# Patient Record
Sex: Female | Born: 1975
Health system: Southern US, Community
[De-identification: ages and names within clinical notes are randomized; demographics above are authoritative.]

## PROBLEM LIST (undated history)

## (undated) DIAGNOSIS — D649 Anemia, unspecified: Secondary | ICD-10-CM

## (undated) DIAGNOSIS — G43909 Migraine, unspecified, not intractable, without status migrainosus: Secondary | ICD-10-CM

## (undated) HISTORY — PX: WISDOM TOOTH EXTRACTION: SHX21

---

## 2002-09-13 ENCOUNTER — Other Ambulatory Visit: Admission: RE | Admit: 2002-09-13 | Discharge: 2002-09-13 | Payer: Self-pay | Admitting: Family Medicine

## 2002-12-20 ENCOUNTER — Encounter: Payer: Self-pay | Admitting: Family Medicine

## 2002-12-20 ENCOUNTER — Encounter: Admission: RE | Admit: 2002-12-20 | Discharge: 2002-12-20 | Payer: Self-pay | Admitting: Family Medicine

## 2003-02-02 ENCOUNTER — Encounter: Admission: RE | Admit: 2003-02-02 | Discharge: 2003-02-02 | Payer: Self-pay | Admitting: Family Medicine

## 2003-02-02 ENCOUNTER — Encounter: Payer: Self-pay | Admitting: Family Medicine

## 2003-12-13 ENCOUNTER — Encounter: Admission: RE | Admit: 2003-12-13 | Discharge: 2003-12-13 | Payer: Self-pay | Admitting: Family Medicine

## 2004-02-06 IMAGING — CR DG LUMBAR SPINE COMPLETE 4+V
5 series · 5 of 5 positions shown · non-contrast
Comparison: none

CLINICAL DATA: Low back pain. 
 LUMBAR SPINE FOUR VIEWS: 
 There are four lumbar type vertebrae with apparent sacralization of L5 or rudimentary ribs of L1.  These findings are similar to the previous study.  There is normal alignment.  No pars defects.  No acute abnormalities.

[view not recorded (1 of 5)]
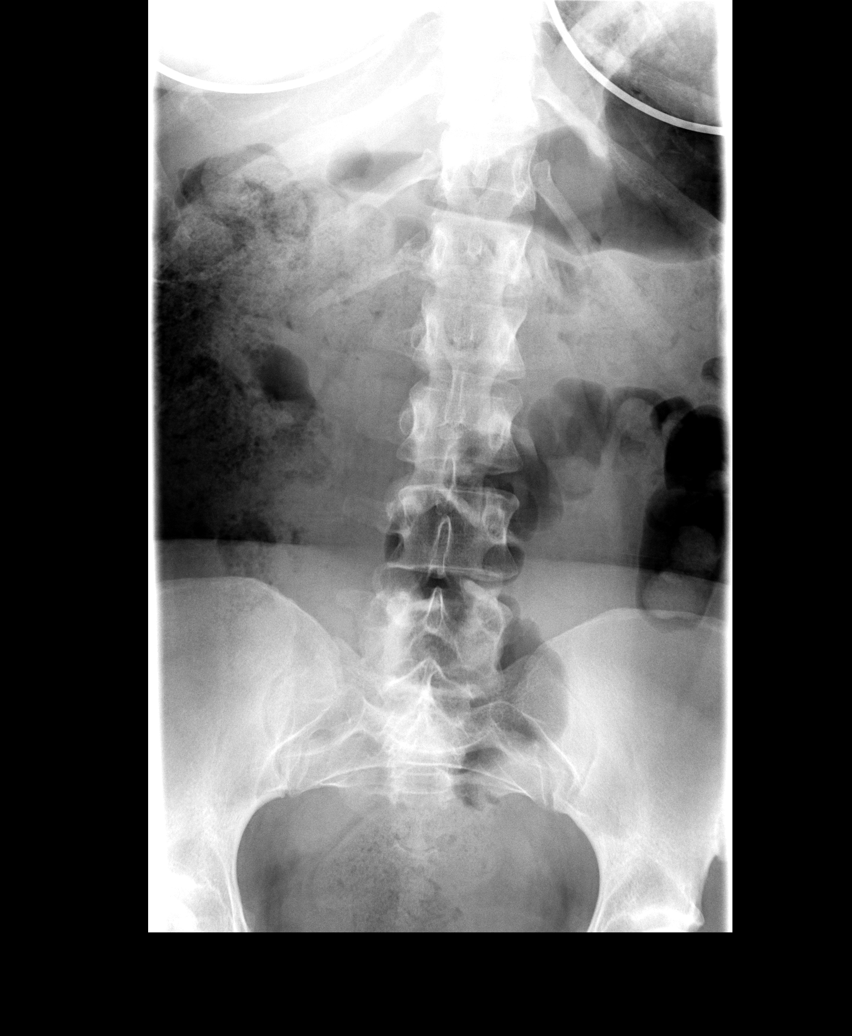

[view not recorded (2 of 5)]
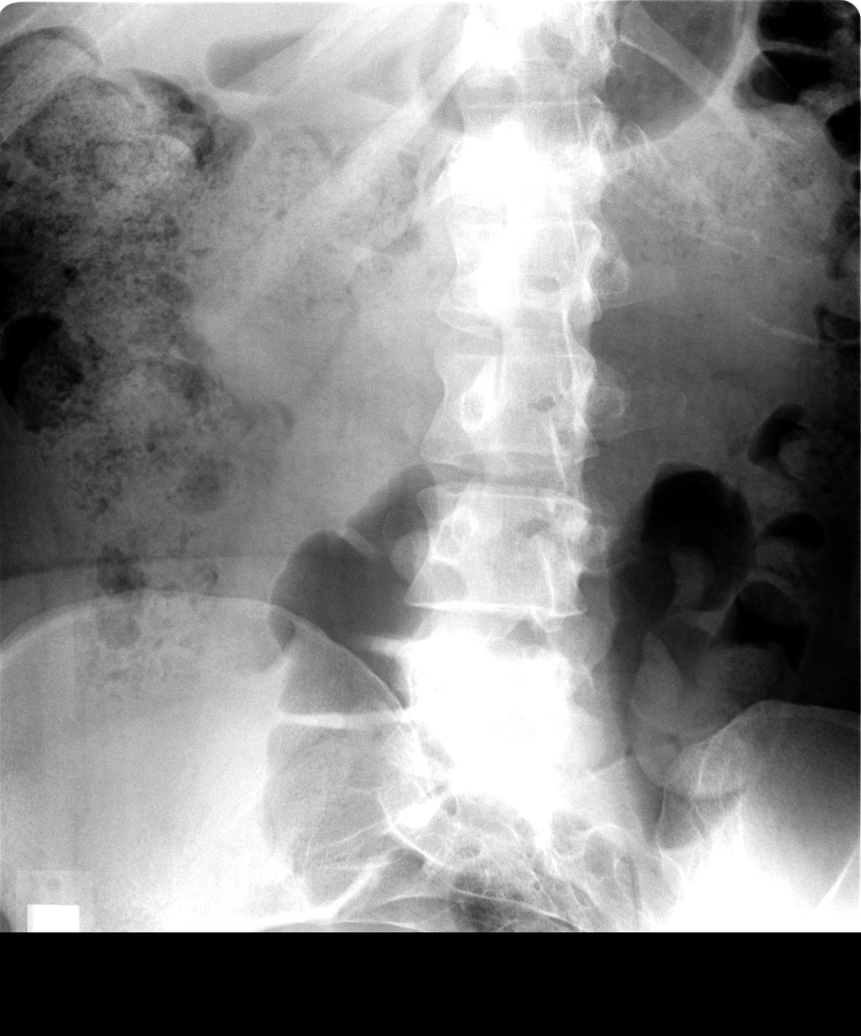

[view not recorded (3 of 5)]
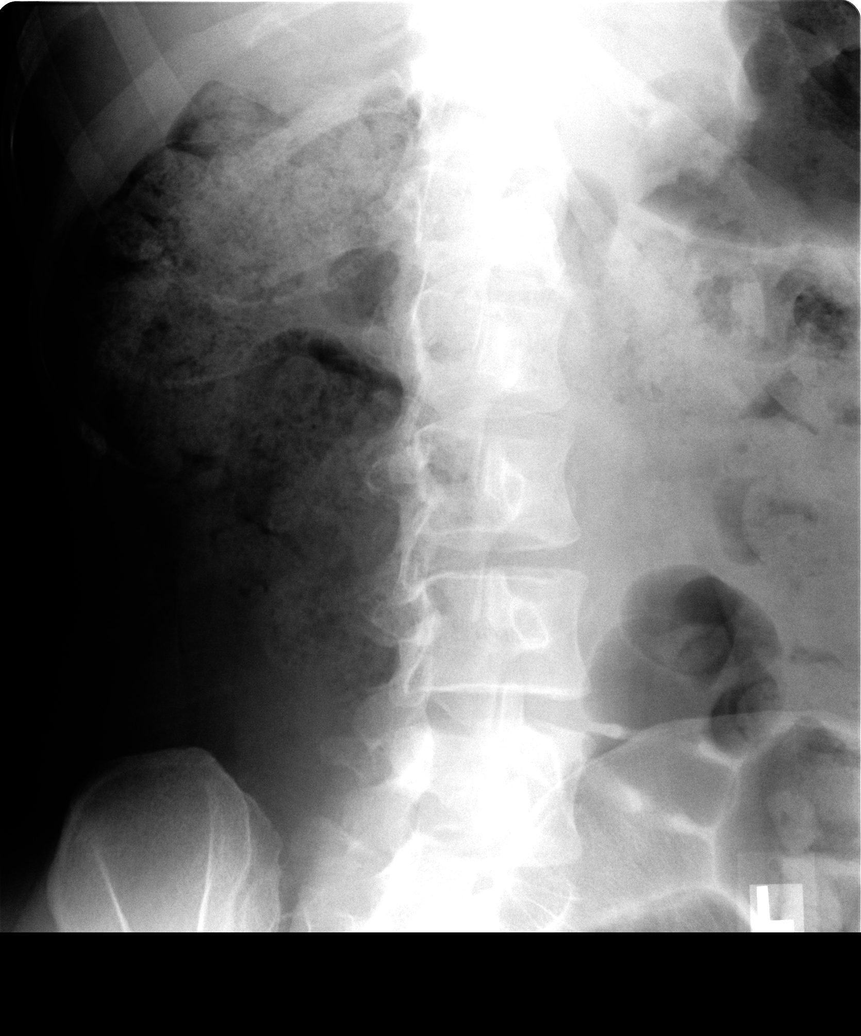

[view not recorded (4 of 5)]
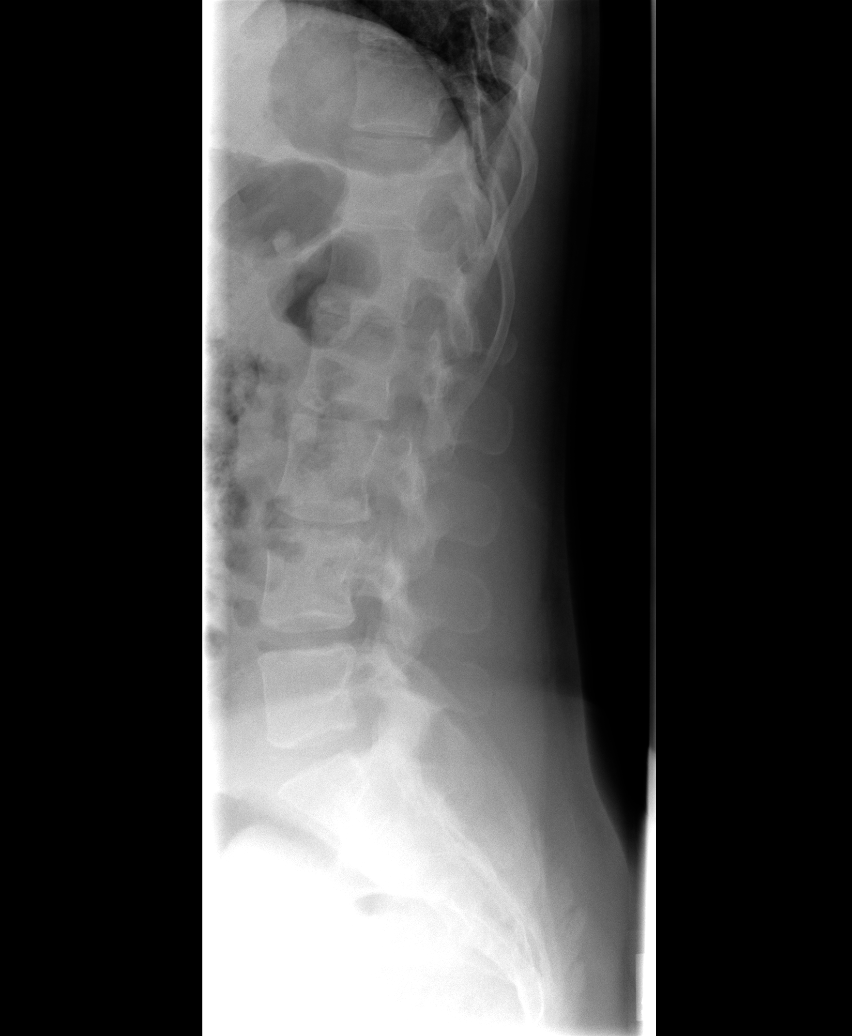

[view not recorded (5 of 5)]
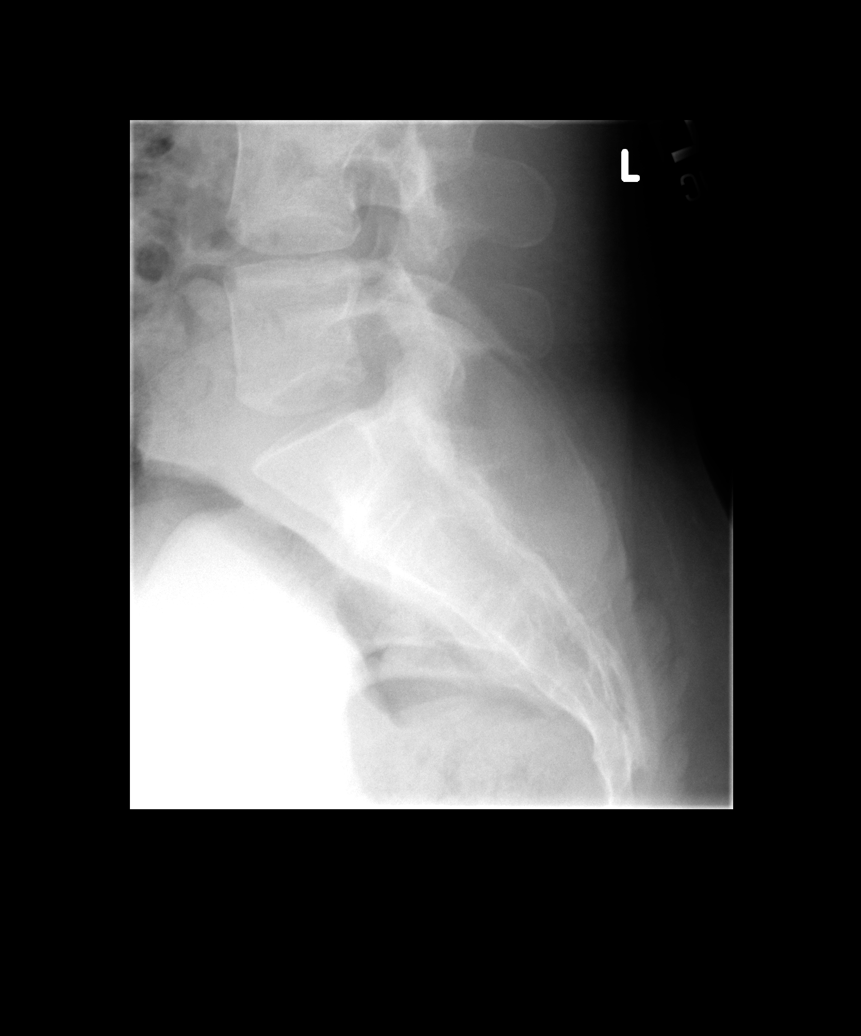

[5 of 5 positions shown; findings below may reference images not displayed]

IMPRESSION: No change.  No bony abnormality.

## 2004-05-03 ENCOUNTER — Other Ambulatory Visit: Admission: RE | Admit: 2004-05-03 | Discharge: 2004-05-03 | Payer: Self-pay | Admitting: Family Medicine

## 2006-04-24 ENCOUNTER — Ambulatory Visit (HOSPITAL_COMMUNITY): Admission: RE | Admit: 2006-04-24 | Discharge: 2006-04-24 | Payer: Self-pay | Admitting: Family Medicine

## 2006-08-27 ENCOUNTER — Ambulatory Visit (HOSPITAL_COMMUNITY): Admission: RE | Admit: 2006-08-27 | Discharge: 2006-08-27 | Payer: Self-pay | Admitting: Family Medicine

## 2007-07-23 ENCOUNTER — Ambulatory Visit (HOSPITAL_COMMUNITY): Admission: RE | Admit: 2007-07-23 | Discharge: 2007-07-23 | Payer: Self-pay | Admitting: Family Medicine

## 2008-05-15 ENCOUNTER — Encounter: Admission: RE | Admit: 2008-05-15 | Discharge: 2008-05-15 | Payer: Self-pay | Admitting: Family Medicine

## 2009-08-06 ENCOUNTER — Encounter: Admission: RE | Admit: 2009-08-06 | Discharge: 2009-08-06 | Payer: Self-pay | Admitting: Family Medicine

## 2010-12-01 ENCOUNTER — Encounter: Payer: Self-pay | Admitting: Family Medicine

## 2015-01-04 ENCOUNTER — Other Ambulatory Visit: Payer: Self-pay | Admitting: Family Medicine

## 2015-01-04 ENCOUNTER — Other Ambulatory Visit: Payer: Self-pay

## 2015-01-04 DIAGNOSIS — Z1231 Encounter for screening mammogram for malignant neoplasm of breast: Secondary | ICD-10-CM

## 2015-10-21 ENCOUNTER — Encounter (HOSPITAL_BASED_OUTPATIENT_CLINIC_OR_DEPARTMENT_OTHER): Payer: Self-pay | Admitting: *Deleted

## 2015-10-21 ENCOUNTER — Emergency Department (HOSPITAL_BASED_OUTPATIENT_CLINIC_OR_DEPARTMENT_OTHER)
Admission: EM | Admit: 2015-10-21 | Discharge: 2015-10-21 | Disposition: A | Discharge status: Home / Self Care | Payer: No Typology Code available for payment source | Attending: Emergency Medicine | Admitting: Emergency Medicine

## 2015-10-21 DIAGNOSIS — Y92410 Unspecified street and highway as the place of occurrence of the external cause: Secondary | ICD-10-CM | POA: Insufficient documentation

## 2015-10-21 DIAGNOSIS — Z880 Allergy status to penicillin: Secondary | ICD-10-CM | POA: Diagnosis not present

## 2015-10-21 DIAGNOSIS — Y9389 Activity, other specified: Secondary | ICD-10-CM | POA: Insufficient documentation

## 2015-10-21 DIAGNOSIS — Z8679 Personal history of other diseases of the circulatory system: Secondary | ICD-10-CM | POA: Insufficient documentation

## 2015-10-21 DIAGNOSIS — S161XXA Strain of muscle, fascia and tendon at neck level, initial encounter: Secondary | ICD-10-CM | POA: Insufficient documentation

## 2015-10-21 DIAGNOSIS — Y998 Other external cause status: Secondary | ICD-10-CM | POA: Insufficient documentation

## 2015-10-21 DIAGNOSIS — S3992XA Unspecified injury of lower back, initial encounter: Secondary | ICD-10-CM | POA: Diagnosis not present

## 2015-10-21 DIAGNOSIS — S199XXA Unspecified injury of neck, initial encounter: Secondary | ICD-10-CM | POA: Diagnosis present

## 2015-10-21 HISTORY — DX: Migraine, unspecified, not intractable, without status migrainosus: G43.909

## 2015-10-21 MED ORDER — METHOCARBAMOL 500 MG PO TABS: 500.0000 mg | ORAL_TABLET | Freq: Two times a day (BID) | ORAL | Status: DC

## 2015-10-21 MED ORDER — IBUPROFEN 800 MG PO TABS
800.0000 mg | ORAL_TABLET | Freq: Once | ORAL | Status: AC
  Administered 2015-10-21: 800 mg via ORAL
  Filled 2015-10-21: qty 1

## 2015-10-21 MED ORDER — NAPROXEN 500 MG PO TABS: 500.0000 mg | ORAL_TABLET | Freq: Two times a day (BID) | ORAL | Status: DC

## 2015-10-21 MED ORDER — HYDROCODONE-ACETAMINOPHEN 5-325 MG PO TABS: 1.0000 | ORAL_TABLET | Freq: Four times a day (QID) | ORAL | Status: DC | PRN

## 2015-10-21 NOTE — Discharge Instructions (Signed)
1. Medications: robaxin, naproxyn, vicodin, usual home medications °2. Treatment: rest, drink plenty of fluids, gentle stretching as discussed, alternate ice and heat °3. Follow Up: Please followup with your primary doctor in 3 days for discussion of your diagnoses and further evaluation after today's visit; if you do not have a primary care doctor use the resource guide provided to find one;  Return to the ER for worsening back pain, difficulty walking, loss of bowel or bladder control or other concerning symptoms ° ° ° ° °Back Exercises °The following exercises strengthen the muscles that help to support the back. They also help to keep the lower back flexible. Doing these exercises can help to prevent back pain or lessen existing pain. °If you have back pain or discomfort, try doing these exercises 2-3 times each day or as told by your health care provider. When the pain goes away, do them once each day, but increase the number of times that you repeat the steps for each exercise (do more repetitions). If you do not have back pain or discomfort, do these exercises once each day or as told by your health care provider. °EXERCISES °Single Knee to Chest °Repeat these steps 3-5 times for each leg: °1. Lie on your back on a firm bed or the floor with your legs extended. °2. Bring one knee to your chest. Your other leg should stay extended and in contact with the floor. °3. Hold your knee in place by grabbing your knee or thigh. °4. Pull on your knee until you feel a gentle stretch in your lower back. °5. Hold the stretch for 10-30 seconds. °6. Slowly release and straighten your leg. °Pelvic Tilt °Repeat these steps 5-10 times: °1. Lie on your back on a firm bed or the floor with your legs extended. °2. Bend your knees so they are pointing toward the ceiling and your feet are flat on the floor. °3. Tighten your lower abdominal muscles to press your lower back against the floor. This motion will tilt your pelvis so your  tailbone points up toward the ceiling instead of pointing to your feet or the floor. °4. With gentle tension and even breathing, hold this position for 5-10 seconds. °Cat-Cow °Repeat these steps until your lower back becomes more flexible: °1. Get into a hands-and-knees position on a firm surface. Keep your hands under your shoulders, and keep your knees under your hips. You may place padding under your knees for comfort. °2. Let your head hang down, and point your tailbone toward the floor so your lower back becomes rounded like the back of a cat. °3. Hold this position for 5 seconds. °4. Slowly lift your head and point your tailbone up toward the ceiling so your back forms a sagging arch like the back of a cow. °5. Hold this position for 5 seconds. °Press-Ups °Repeat these steps 5-10 times: °1. Lie on your abdomen (face-down) on the floor. °2. Place your palms near your head, about shoulder-width apart. °3. While you keep your back as relaxed as possible and keep your hips on the floor, slowly straighten your arms to raise the top half of your body and lift your shoulders. Do not use your back muscles to raise your upper torso. You may adjust the placement of your hands to make yourself more comfortable. °4. Hold this position for 5 seconds while you keep your back relaxed. °5. Slowly return to lying flat on the floor. °Bridges °Repeat these steps 10 times: °1. Lie on   your back on a firm surface. 2. Bend your knees so they are pointing toward the ceiling and your feet are flat on the floor. 3. Tighten your buttocks muscles and lift your buttocks off of the floor until your waist is at almost the same height as your knees. You should feel the muscles working in your buttocks and the back of your thighs. If you do not feel these muscles, slide your feet 1-2 inches farther away from your buttocks. 4. Hold this position for 3-5 seconds. 5. Slowly lower your hips to the starting position, and allow your buttocks  muscles to relax completely. If this exercise is too easy, try doing it with your arms crossed over your chest. Abdominal Crunches Repeat these steps 5-10 times: 1. Lie on your back on a firm bed or the floor with your legs extended. 2. Bend your knees so they are pointing toward the ceiling and your feet are flat on the floor. 3. Cross your arms over your chest. 4. Tip your chin slightly toward your chest without bending your neck. 5. Tighten your abdominal muscles and slowly raise your trunk (torso) high enough to lift your shoulder blades a tiny bit off of the floor. Avoid raising your torso higher than that, because it can put too much stress on your low back and it does not help to strengthen your abdominal muscles. 6. Slowly return to your starting position. Back Lifts Repeat these steps 5-10 times: 1. Lie on your abdomen (face-down) with your arms at your sides, and rest your forehead on the floor. 2. Tighten the muscles in your legs and your buttocks. 3. Slowly lift your chest off of the floor while you keep your hips pressed to the floor. Keep the back of your head in line with the curve in your back. Your eyes should be looking at the floor. 4. Hold this position for 3-5 seconds. 5. Slowly return to your starting position. SEEK MEDICAL CARE IF:  Your back pain or discomfort gets much worse when you do an exercise.  Your back pain or discomfort does not lessen within 2 hours after you exercise. If you have any of these problems, stop doing these exercises right away. Do not do them again unless your health care provider says that you can. SEEK IMMEDIATE MEDICAL CARE IF:  You develop sudden, severe back pain. If this happens, stop doing the exercises right away. Do not do them again unless your health care provider says that you can.   This information is not intended to replace advice given to you by your health care provider. Make sure you discuss any questions you have with your  health care provider.   Document Released: 12/04/2004 Document Revised: 07/18/2015 Document Reviewed: 12/21/2014 Elsevier Interactive Patient Education 2016 [employer].    Emergency Department Resource Guide 1) Find a Doctor and Pay Out of Pocket Although you won't have to find out who is covered by your insurance plan, it is a good idea to ask around and get recommendations. You will then need to call the office and see if the doctor you have chosen will accept you as a new patient and what types of options they offer for patients who are self-pay. Some doctors offer discounts or will set up payment plans for their patients who do not have insurance, but you will need to ask so you aren't surprised when you get to your appointment.  2) Contact Your Local Health Department Not all health departments  have doctors that can see patients for sick visits, but many do, so it is worth a call to see if yours does. If you don't know where your local health department is, you can check in your phone book. The CDC also has a tool to help you locate your state's health department, and many state websites also have listings of all of their local health departments.  3) Find a Walk-in Clinic If your illness is not likely to be very severe or complicated, you may want to try a walk in clinic. These are popping up all over the country in pharmacies, drugstores, and shopping centers. They're usually staffed by nurse practitioners or physician assistants that have been trained to treat common illnesses and complaints. They're usually fairly quick and inexpensive. However, if you have serious medical issues or chronic medical problems, these are probably not your best option.  No Primary Care Doctor: - Call Health Connect at  [phone] - they can help you locate a primary care doctor that  accepts your insurance, provides certain services, etc. - Physician Referral Service- [phone]  Chronic Pain  Problems: Organization         Address  Phone   Notes  [facility]  [phone] Patients need to be referred by their primary care doctor.   Medication Assistance: Organization         Address  Phone   Notes  [facility] Medication [facility] [address] [place], [place] [address] [phone] --Must be a resident of [facility] -- Must have NO insurance coverage whatsoever (no Medicaid/ Medicare, etc.) -- The pt. MUST have a primary care doctor that directs their care regularly and follows them in the community   [facility]  [phone]   [employer]  [phone]    Agencies that provide inexpensive medical care: Organization         Address  Phone   Notes  [facility]  [phone]   [facility]    [phone]   [facility] [address] [place], [place] [address] [phone]   [facility] [address], [place] [phone]   Planned Parenthood    [phone]   [facility]    [phone]   [facility]  [address], [place] Phone:  [phone], Fax:  [phone] Hours of Operation:  9 am - 6 pm, M-F.  Also accepts Medicaid/Medicare and self-pay.  [facility]  [address], [place] Phone: [phone], Fax: [phone]. Hours of Operation:  8:30 am - 5:30 pm, M-F.  Also accepts Medicaid and self-pay.  [facility] [place] [address], [place] Phone: [phone]   [facility] [address] Natasha Bence [place], [place] [phone], Ext. [phone] Mondays & Thursdays: 7-9 AM.  First 15 patients are seen on a first come, first serve basis.    Medicaid-accepting [facility] Providers:  Organization         Address  Phone   Notes  [facility] [address], [place] [phone] Also  accepts self-pay patients.  [facility] [address] Laurell Josephs [place], [place]  [phone]   [facility] [address], [place] [phone]   [facility]  [address] [place] [address], [place] [phone]   Renaye RakersVeita Bland [address], [place]   [phone]) [phone] Only accepts [place] Access [place] patients after they have their name applied to their card.   Self-Pay (no insurance) in [facility]:  Organization         Address  Phone   Notes  Sickle Cell Patients, [facility] [address], [place] [phone]   [facility] [address], [place] [phone]   [facility]  [address], [place] [phone]   [facility]/Dr. Osei-Bonsu  [address] [place] [address], [place] or [phone], [place] [phone]) [phone] Phone number for both [place] and [place] locations is the same.  [facility] [address], [place] [phone]) [phone]   [facility] [address] [place] [address], [place] or [address] [phone]) [phone]) [phone]   [facility] [address], [place] [phone], phone; [phone]) [phone], fax Sees patients 1st and 3rd Saturday of every month.  Must not qualify for public or private insurance (i.e. Medicaid, Medicare, [place] Health Choice, Veterans' Benefits)  Household income should be no more than 200% of the poverty level The clinic cannot treat you if you are pregnant or think you are pregnant  Sexually transmitted diseases are not treated at the clinic.    Dental Care: Organization         Address  Phone  Notes  [facility] [address], [place] [phone] Accepts children up to age 41 who are enrolled in [place] or [place] Health Choice; pregnant  women with a Medicaid card; and children who have applied for Medicaid or [place] Health Choice, but were declined, whose parents can pay a reduced fee at time of service.  [facility] [place]  [address], [place] [phone] Accepts children up to age 41 who are enrolled in [place] or [place] Health Choice; pregnant women with a Medicaid card; and children who have applied for Medicaid or  Health Choice, but were declined, whose parents can pay a reduced fee at time of service.  [facility]  [address], [place] [phone] Patients are seen by appointment only. Walk-ins are not accepted. [facility] will see patients 41 years of age and older. Monday - Tuesday (8am-5pm) Most Wednesdays (8:30-5pm) $30 per visit, cash only  [facility]  [address], [facility] [phone] Patients are seen by appointment only. Walk-ins are not accepted. [facility] will see patients 41 years of age and older. One Wednesday Evening (Monthly: Volunteer Based).  $30 per visit, cash only  [facility]  [phone]) [phone] for adults; Children under age 614, call Graduate Pediatric Dentistry at [phone]. Children aged 244-14, please call [phone] to request a pediatric application.  Dental services are provided in all areas of dental care including fillings, crowns and bridges, complete and partial dentures, implants, gum treatment, root canals, and extractions. Preventive care is also provided. Treatment is provided to both adults and children. Patients are selected via a lottery and there is often a waiting list.   [facility] [address], [place]  [phone] [email or web address]   [facility] [phone]  [address], [place], [place] [phone], Ext. [phone] Second and Fourth Thursday of each month, opens at 6:30 AM; Clinic ends at 9 AM.  Patients are  seen on a first-come first-served basis, and a limited number are seen during each clinic.  ° °[facility] ° [address], [place], [place] [phone]   Eligibility Requirements °You must have lived in [place], [place], or [place] counties for at least the last three months. °  You cannot be eligible for state or federal sponsored healthcare insurance, including Veterans Administration, Medicaid, or Medicare. °  You generally cannot be eligible for healthcare insurance through your employer.  °  How to apply: °Eligibility screenings are held every Tuesday and Wednesday afternoon from 1:00 pm until 4:00 pm. You do not need an appointment for the interview!  °[facility] [address], [place], [place] [phone]   °[place] [facility]  [phone]   °[place] [facility]  336-703-3100   ° County [facility]  336-570-6415   ° °Behavioral Health Resources in the Community: °Intensive Outpatient Programs °Organization         Address  Phone  Notes  °[place] [facility] [address], [place], [place] [phone]   °[facility] [address], [place], [place] [phone]   °[facility] [address], [place], [place] ° 336-882-2125   °[facility] [address],  °[place], [place] 1-800-853-5163 or 336-641-4981   °Substance Abuse Resources °Organization         Address  Phone  Notes  °[facility]  336-882-2125   °[facility]  336-784-9470   °[facility]  336-285-9073   °[facility]  336-845-3988   °Residential & Outpatient Substance Abuse Program  1-800-659-3381   °Psychological Services °Organization         Address  Phone  Notes  °[facility]  336- 832-9600   °[facility]  336- 378-7881   °[facility] [address], [place] 1-800-853-5163 or 336-641-4981   ° °Mobile Crisis  Teams °Organization         Address  Phone  Notes  °[facility], Mobile Crisis Care Unit  1-877-626-1772   °Assertive °Psychotherapeutic Services ° [address] [place], [place] 336-834-9664   °Sharon DeEsch [address] °[place] 336-554-5454   ° °Self-Help/Support Groups °Organization         Address  Phone             Notes  °[facility] [place] - variety of support groups  336- 373-1402 Call for more information  °Narcotics Anonymous (NA), [facility] [address], °[place]  2 meetings at this location  ° °Residential Treatment Programs °Organization         Address  Phone  Notes  °[facility] [address],    °[place]  1-866-801-8205   °[facility] ° [address], [place], [place] [phone]   °[facility] [address], [place] 336-845-3988 Admissions: 8am-3pm M-F  °[facility] [address]    °[place], [place] 336-841-1104   °[facility] [address], [place], [place] 336-379-7146   °[facility] [address]  °[place],  336-285-9073   °[facility] - Intensive Outpatient [address], ,   [place] [phone]   [facility] ([facility] [address]  [place], [place] [phone] or [phone]   [facility] ([facility]) [address] [place], [place] [phone] Accepts Medicaid  [facility] [address]  [place] [phone] Substance Abuse/Addiction Treatment   [facility] Resources Organization         Address  Phone  Notes  [facility]  [phone]) [phone]   Angie FavaJulie Brannon, PhD [address], [address] [place], [place]   [phone](336) [phone] or [phone](336) [phone]   [facility]   [address] [place], [place] [phone]) [phone]   [facility] [address], [place], [place] [phone]) [phone]  Insurance/Medicaid/sponsorship through [facility]  [facility] [address]                                    [place], [place] [phone]) [phone] Therapy/tele-psych/case  [facility] [address]   [place], [place] [phone](336) [phone]    Dr. Lolly MustacheArfeen  [phone](336) [phone]   [facility]  [employer] [facility] 1) [address], [place] 2) [address], [place] 3)  [address], [place] [phone]) [phone]) [phone]  [phone](336) [phone]   [facility] Child Abuse Hotline [phone](336) [phone] or [phone](336) [phone] (After Hours)
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
## 2015-10-29 DIAGNOSIS — F419 Anxiety disorder, unspecified: Secondary | ICD-10-CM | POA: Insufficient documentation

## 2015-11-06 DIAGNOSIS — F5104 Psychophysiologic insomnia: Secondary | ICD-10-CM | POA: Insufficient documentation

## 2016-05-25 ENCOUNTER — Emergency Department (HOSPITAL_BASED_OUTPATIENT_CLINIC_OR_DEPARTMENT_OTHER)
Admission: EM | Admit: 2016-05-25 | Discharge: 2016-05-25 | Disposition: A | Discharge status: Home / Self Care | Payer: No Typology Code available for payment source | Attending: Emergency Medicine | Admitting: Emergency Medicine

## 2016-05-25 ENCOUNTER — Encounter (HOSPITAL_BASED_OUTPATIENT_CLINIC_OR_DEPARTMENT_OTHER): Payer: Self-pay | Admitting: *Deleted

## 2016-05-25 DIAGNOSIS — I7300 Raynaud's syndrome without gangrene: Secondary | ICD-10-CM | POA: Insufficient documentation

## 2016-05-25 DIAGNOSIS — Z79899 Other long term (current) drug therapy: Secondary | ICD-10-CM | POA: Insufficient documentation

## 2016-05-25 HISTORY — DX: Anemia, unspecified: D64.9

## 2016-05-25 MED ORDER — DILTIAZEM HCL 30 MG PO TABS
60.0000 mg | ORAL_TABLET | Freq: Once | ORAL | Status: AC
  Administered 2016-05-25: 60 mg via ORAL
  Filled 2016-05-25: qty 2

## 2016-05-25 NOTE — ED Provider Notes (Signed)
CSN: [id number]     Arrival date & time 05/25/16  1630 History  By signing my name below, I, Renee Fitzgerald, attest that this documentation has been prepared under the direction and in the presence of Raeford RazorStephen Mckensey Berghuis, MD . Electronically Signed: Levon HedgerElizabeth Fitzgerald, Scribe. 05/25/2016. 6:01 PM.   Chief Complaint  Patient presents with  . Numbness   The history is provided by the patient. No language interpreter was used.    HPI Comments:  Renee Fitzgerald is a 40 y.o. female who presents to the Emergency Department complaining of sudden onset left index finger numbness. Pt notes associated color change (whiteness) and discomfort. Pt states she was eating lunch with friends when numbness started. She was drinking a cold Coke at time of onset, but was not holding it in left hand. No alleviating factors noted. No other complaints at this time.    Past Medical History  Diagnosis Date  . Migraine   . Anemia    Past Surgical History  Procedure Laterality Date  . Wisdom tooth extraction     History reviewed. No pertinent family history. Social History  Substance Use Topics  . Smoking status: Never Smoker   . Smokeless tobacco: Renee Fitzgerald  . Alcohol Use: No   OB History    No data available     Review of Systems  Constitutional: Negative for fever.  Skin: Positive for color change.  Neurological: Positive for numbness.  All other systems reviewed and are negative.  Allergies  Penicillins  Home Medications   Prior to Admission medications   Medication Sig Start Date End Date Taking? Authorizing Provider  Levonorgestrel-Ethinyl Estrad (MARLISSA PO) Take by mouth.   Yes Historical Provider, MD  HYDROcodone-acetaminophen (NORCO/VICODIN) 5-325 MG tablet Take 1-2 tablets by mouth every 6 (six) hours as needed for moderate pain or severe pain. 10/21/15   [name], PA-C  methocarbamol (ROBAXIN) 500 MG tablet Take 1 tablet (500 mg total) by mouth 2 (two) times daily. 10/21/15   Hannah  Muthersbaugh, PA-C  naproxen (NAPROSYN) 500 MG tablet Take 1 tablet (500 mg total) by mouth 2 (two) times daily with a meal. 10/21/15   [name], PA-C   BP 162/110 mmHg  Pulse 80  Temp(Src) 98.1 F (36.7 C) (Oral)  Resp 20  Ht 5\' 2"  (1.575 m)  Wt 170 lb (77.111 kg)  BMI 31.09 kg/m2  SpO2 99%  LMP 05/04/2016  Physical Exam  Constitutional: She is oriented to person, place, and time. She appears well-developed and well-nourished. No distress.  HENT:  Head: Normocephalic and atraumatic.  Eyes: Conjunctivae are normal.  Cardiovascular: Normal rate and regular rhythm.   No murmur heard. Pulmonary/Chest: Effort normal.  Abdominal: She exhibits no distension.  Neurological: She is alert and oriented to person, place, and time.  Skin: Skin is warm and dry.  Bluish purple hue to distal left index finger and, to a lesser degree, distal pinky finger Clearly demarcated Sluggish cap refill  Psychiatric: She has a normal mood and affect.  Nursing note and vitals reviewed.   ED Course  Procedures  DIAGNOSTIC STUDIES:  Oxygen Saturation is 99% on RA, normal by my interpretation.    COORDINATION OF CARE:  5:54 PM Discussed treatment plan which includes keeping hands warm with pt at bedside and pt agreed to plan.  Labs Review Labs Reviewed - No data to display  Imaging Review No results found. I have personally reviewed and evaluated these images and lab results as part of my  medical decision-making.   EKG Interpretation Renee Fitzgerald       MDM   Final diagnoses:  Raynaud's phenomenon    40 year old female with numbness and skin changes to distal digits left hand. Apparently white in color earlier. She was given a heating pack with skin changing to violaceous hue. Clear demarcation. Exam and symptoms consistent with Raynaud's phenomenon. Doubt complete arterial occlusion or other emergent process. Tried to reassure. Keep warm. Avoid cold exposure. It has been determined  that no acute conditions requiring further emergency intervention are present at this time. The patient has been advised of the diagnosis and plan. I reviewed any labs and imaging including any potential incidental findings. We have discussed signs and symptoms that warrant return to the ED and they are listed in the discharge instructions.    I personally preformed the services scribed in my presence. The recorded information has been reviewed is accurate. [name], MD.    [name], MD 05/25/16 [phone]

## 2016-05-25 NOTE — ED Notes (Signed)
MD at bedside. 

## 2016-05-25 NOTE — ED Notes (Signed)
Per MD: heat packs placed (one in L armpit, one on L fingers).

## 2016-05-25 NOTE — Discharge Instructions (Signed)

## 2016-05-25 NOTE — ED Notes (Signed)
Pt states she was just sitting there eating lunch and noticed her left index finger was tingling and turning white. 1 week ago noticed pain in fingers and wrist hurting.

## 2016-06-04 ENCOUNTER — Other Ambulatory Visit: Payer: Self-pay | Admitting: Family Medicine

## 2016-06-04 DIAGNOSIS — M79645 Pain in left finger(s): Secondary | ICD-10-CM

## 2016-06-06 ENCOUNTER — Other Ambulatory Visit (HOSPITAL_COMMUNITY): Payer: Self-pay | Admitting: Family Medicine

## 2016-06-06 ENCOUNTER — Ambulatory Visit (HOSPITAL_COMMUNITY)
Admission: RE | Admit: 2016-06-06 | Discharge: 2016-06-06 | Disposition: A | Discharge status: Home / Self Care | Payer: Self-pay | Source: Ambulatory Visit | Attending: Vascular Surgery | Admitting: Vascular Surgery

## 2016-06-06 DIAGNOSIS — L819 Disorder of pigmentation, unspecified: Secondary | ICD-10-CM

## 2017-08-05 ENCOUNTER — Emergency Department (HOSPITAL_BASED_OUTPATIENT_CLINIC_OR_DEPARTMENT_OTHER)
Admission: EM | Admit: 2017-08-05 | Discharge: 2017-08-05 | Disposition: A | Discharge status: Home / Self Care | Payer: 59 | Attending: Emergency Medicine | Admitting: Emergency Medicine

## 2017-08-05 ENCOUNTER — Encounter (HOSPITAL_BASED_OUTPATIENT_CLINIC_OR_DEPARTMENT_OTHER): Payer: Self-pay

## 2017-08-05 DIAGNOSIS — R112 Nausea with vomiting, unspecified: Secondary | ICD-10-CM | POA: Insufficient documentation

## 2017-08-05 DIAGNOSIS — D649 Anemia, unspecified: Secondary | ICD-10-CM | POA: Diagnosis not present

## 2017-08-05 DIAGNOSIS — R51 Headache: Secondary | ICD-10-CM | POA: Diagnosis present

## 2017-08-05 DIAGNOSIS — Z79899 Other long term (current) drug therapy: Secondary | ICD-10-CM | POA: Insufficient documentation

## 2017-08-05 DIAGNOSIS — G43911 Migraine, unspecified, intractable, with status migrainosus: Secondary | ICD-10-CM | POA: Diagnosis not present

## 2017-08-05 MED ORDER — DEXAMETHASONE SODIUM PHOSPHATE 10 MG/ML IJ SOLN
10.0000 mg | Freq: Once | INTRAMUSCULAR | Status: AC
  Administered 2017-08-05: 10 mg via INTRAVENOUS
  Filled 2017-08-05: qty 1

## 2017-08-05 MED ORDER — METOCLOPRAMIDE HCL 5 MG/ML IJ SOLN
10.0000 mg | Freq: Once | INTRAMUSCULAR | Status: AC
  Administered 2017-08-05: 10 mg via INTRAVENOUS
  Filled 2017-08-05: qty 2

## 2017-08-05 MED ORDER — SODIUM CHLORIDE 0.9 % IV BOLUS (SEPSIS)
1000.0000 mL | Freq: Once | INTRAVENOUS | Status: AC
  Administered 2017-08-05: 1000 mL via INTRAVENOUS

## 2017-08-05 MED ORDER — DIPHENHYDRAMINE HCL 50 MG/ML IJ SOLN
25.0000 mg | Freq: Once | INTRAMUSCULAR | Status: AC
  Administered 2017-08-05: 25 mg via INTRAVENOUS
  Filled 2017-08-05: qty 1

## 2017-08-05 MED ORDER — ONDANSETRON 4 MG PO TBDP: 4.0000 mg | ORAL_TABLET | Freq: Three times a day (TID) | ORAL | 0 refills | Status: DC | PRN

## 2017-08-05 NOTE — ED Notes (Signed)
Attempt IV placement in both left and right AC without success.  Charge RN notified who went in to start IV

## 2017-08-05 NOTE — ED Provider Notes (Signed)
[facility]   CSN: [id number] Arrival date & time: 08/05/17  2005     History   Chief Complaint Chief Complaint  Patient presents with  . Migraine    HPI Renee Fitzgerald is a 41 y.o. female.  HPI  41 year old female with a history of migraines presents with vomiting and migraine. She states that typically her migraines are abated with Maxalt. Her headache started yesterday morning. She tried the Maxalt last night but then threw it up. She tried Phenergan this morning but also vomited that. Went to urgent care and was given IM Toradol and IM Phenergan but then had vomiting and was sent here. She states the headache is a little worse than typical but is the exact same otherwise. Starts behind her right eye and goes to her occiput. There is associated dizziness which is typical. No blurry vision but some photophobia. She denies any weakness, numbness, or neck stiffness. No fevers during this time. Headache is about an 8/10.  Past Medical History:  Diagnosis Date  . Anemia   . Migraine     There are no active problems to display for this patient.   Past Surgical History:  Procedure Laterality Date  . WISDOM TOOTH EXTRACTION      OB History    No data available       Home Medications    Prior to Admission medications   Medication Sig Start Date End Date Taking? Authorizing Provider  promethazine (PHENERGAN) 25 MG tablet Take 25 mg by mouth every 6 (six) hours as needed for nausea or vomiting.   Yes [provider]  Rizatriptan Benzoate (MAXALT PO) Take by mouth.   Yes [provider]  Levonorgestrel-Ethinyl Estrad (MARLISSA PO) Take by mouth.    [provider]  ondansetron (ZOFRAN ODT) 4 MG disintegrating tablet Take 1 tablet (4 mg total) by mouth every 8 (eight) hours as needed for nausea or vomiting. 08/05/17   Pricilla Loveless, MD    Family History No family history on file.  Social History Social History    Substance Use Topics  . Smoking status: Never Smoker  . Smokeless tobacco: Never Used  . Alcohol use No     Allergies   Penicillins   Review of Systems Review of Systems  Constitutional: Negative for fever.  Eyes: Positive for photophobia. Negative for visual disturbance.  Respiratory: Negative for shortness of breath.   Cardiovascular: Negative for chest pain.  Gastrointestinal: Positive for nausea and vomiting. Negative for abdominal pain.  Genitourinary: Negative for dysuria and menstrual problem.  Musculoskeletal: Negative for neck pain and neck stiffness.  Neurological: Positive for dizziness and headaches. Negative for weakness and numbness.  All other systems reviewed and are negative.    Physical Exam Updated Vital Signs BP 123/82 (BP Location: Left Arm)   Pulse 85   Temp 98.4 F (36.9 C) (Oral)   Resp 16   Ht 5' 2.5" (1.588 m)   Wt 82.6 kg (182 lb)   LMP 07/25/2017   SpO2 100%   BMI 32.76 kg/m   Physical Exam  Constitutional: She is oriented to person, place, and time. She appears well-developed and well-nourished. No distress.  HENT:  Head: Normocephalic and atraumatic.  Right Ear: External ear normal.  Left Ear: External ear normal.  Nose: Nose normal.  Eyes: Pupils are equal, round, and reactive to light. EOM are normal. Right eye exhibits no discharge. Left eye exhibits no discharge.  Neck: Normal range of  motion. Neck supple.  Cardiovascular: Normal rate, regular rhythm and normal heart sounds.   Pulmonary/Chest: Effort normal and breath sounds normal.  Abdominal: Soft. There is no tenderness.  Neurological: She is alert and oriented to person, place, and time.  CN 3-12 grossly intact. 5/5 strength in all 4 extremities. Grossly normal sensation. Normal finger to nose.   Skin: Skin is warm and dry. She is not diaphoretic.  Nursing note and vitals reviewed.    ED Treatments / Results  Labs (all labs ordered are listed, but only abnormal  results are displayed) Labs Reviewed - No data to display  EKG  EKG Interpretation None       Radiology No results found.  Procedures Procedures (including critical care time)  Medications Ordered in ED Medications  metoCLOPramide (REGLAN) injection 10 mg (10 mg Intravenous Given 08/05/17 2104)  diphenhydrAMINE (BENADRYL) injection 25 mg (25 mg Intravenous Given 08/05/17 2103)  sodium chloride 0.9 % bolus 1,000 mL (0 mLs Intravenous Stopped 08/05/17 2208)  dexamethasone (DECADRON) injection 10 mg (10 mg Intravenous Given 08/05/17 2109)     Initial Impression / Assessment and Plan / ED Course  I have reviewed the triage vital signs and the nursing notes.  Pertinent labs & imaging results that were available during my care of the patient were reviewed by me and considered in my medical decision making (see chart for details).     Feels better after Reglan, benadryl and fluids. Still has moderate headache. No vomiting. I offered further HA treatment but she declines and wants to go home. VS unremarkable. highly doubt meningitis, acute head bleed, SAH, etc. Plan to d/c home with f/u with PCP. Strict return precautions.   Final Clinical Impressions(s) / ED Diagnoses   Final diagnoses:  Intractable migraine with status migrainosus, unspecified migraine type    New Prescriptions Discharge Medication List as of 08/05/2017  9:50 PM    START taking these medications   Details  ondansetron (ZOFRAN ODT) 4 MG disintegrating tablet Take 1 tablet (4 mg total) by mouth every 8 (eight) hours as needed for nausea or vomiting., Starting Wed 08/05/2017, Print         Pricilla Loveless, MD 08/06/17 [phone]

## 2017-08-05 NOTE — ED Triage Notes (Addendum)
C/o migraine, n/v-started yesterday-pt with slow gait/eyes closed-refused w/c

## 2017-08-05 NOTE — ED Notes (Signed)
PT was seen at [facility] received report from RN at [facility] that pt was given  phenergan IM and 60 of toradol IM

## 2017-09-24 ENCOUNTER — Other Ambulatory Visit: Payer: Self-pay | Admitting: Family Medicine

## 2017-09-24 DIAGNOSIS — M545 Low back pain, unspecified: Secondary | ICD-10-CM

## 2017-10-08 ENCOUNTER — Other Ambulatory Visit: Payer: 59

## 2018-02-12 ENCOUNTER — Emergency Department (HOSPITAL_BASED_OUTPATIENT_CLINIC_OR_DEPARTMENT_OTHER)
Admission: EM | Admit: 2018-02-12 | Discharge: 2018-02-12 | Disposition: A | Discharge status: Home / Self Care | Payer: BLUE CROSS/BLUE SHIELD | Attending: Emergency Medicine | Admitting: Emergency Medicine

## 2018-02-12 ENCOUNTER — Other Ambulatory Visit: Payer: Self-pay

## 2018-02-12 ENCOUNTER — Encounter (HOSPITAL_BASED_OUTPATIENT_CLINIC_OR_DEPARTMENT_OTHER): Payer: Self-pay | Admitting: *Deleted

## 2018-02-12 DIAGNOSIS — R05 Cough: Secondary | ICD-10-CM | POA: Diagnosis present

## 2018-02-12 DIAGNOSIS — R197 Diarrhea, unspecified: Secondary | ICD-10-CM | POA: Diagnosis not present

## 2018-02-12 DIAGNOSIS — R112 Nausea with vomiting, unspecified: Secondary | ICD-10-CM

## 2018-02-12 DIAGNOSIS — Z79899 Other long term (current) drug therapy: Secondary | ICD-10-CM | POA: Insufficient documentation

## 2018-02-12 LAB — URINALYSIS, MICROSCOPIC (REFLEX)

## 2018-02-12 LAB — URINALYSIS, ROUTINE W REFLEX MICROSCOPIC
BILIRUBIN URINE: NEGATIVE
Glucose, UA: NEGATIVE mg/dL
HGB URINE DIPSTICK: NEGATIVE
KETONES UR: NEGATIVE mg/dL
NITRITE: NEGATIVE
PROTEIN: NEGATIVE mg/dL
Specific Gravity, Urine: 1.025 (ref 1.005–1.030)
pH: 6 (ref 5.0–8.0)

## 2018-02-12 LAB — PREGNANCY, URINE: PREG TEST UR: NEGATIVE

## 2018-02-12 MED ORDER — SODIUM CHLORIDE 0.9 % IV BOLUS
1000.0000 mL | Freq: Once | INTRAVENOUS | Status: AC
  Administered 2018-02-12: 500 mL via INTRAVENOUS

## 2018-02-12 MED ORDER — ONDANSETRON 8 MG PO TBDP: 8.0000 mg | ORAL_TABLET | Freq: Three times a day (TID) | ORAL | 0 refills | Status: DC | PRN

## 2018-02-12 NOTE — ED Provider Notes (Signed)
[facility] Provider Note: Renee Fitzgerald [name], MD, FACEP  CSN: [id number] MRN: [id number] ARRIVAL: 02/12/18 at 0505 ROOM: [facility]11/[facility]   CHIEF COMPLAINT  Vomiting   HISTORY OF PRESENT ILLNESS  02/12/18 5:27 AM Renee Fitzgerald is a 42 y.o. female who has had a cough for the last 2 weeks.  She has been taking NyQuil at bedtime nightly.  She took a dose about midnight.  She awoke this morning with the sensation she could not breathe.  She was itching and her skin was red.  She then had the onset of nausea, vomiting and diarrhea. This was about an hour and 15 minutes ago.  She was having associated body aches, abdominal cramping and lightheadedness.  She is not aware of having a fever.  Prior to arrival EMS established an IV and gave her 4 mg of Zofran with improvement in her nausea and 500 mL of normal saline.  Her lightheadedness and abdominal cramping have resolved.  She is no longer having itching or difficulty breathing.   Past Medical History:  Diagnosis Date  . Anemia   . Migraine     Past Surgical History:  Procedure Laterality Date  . WISDOM TOOTH EXTRACTION      No family history on file.  Social History   Tobacco Use  . Smoking status: Never Smoker  . Smokeless tobacco: Never Used  Substance Use Topics  . Alcohol use: No  . Drug use: No    Prior to Admission medications   Medication Sig Start Date End Date Taking? Authorizing Provider  Levonorgestrel-Ethinyl Estrad (MARLISSA PO) Take by mouth.    [provider]  ondansetron (ZOFRAN ODT) 4 MG disintegrating tablet Take 1 tablet (4 mg total) by mouth every 8 (eight) hours as needed for nausea or vomiting. 08/05/17   Pricilla LovelessGoldston, Scott, MD  promethazine (PHENERGAN) 25 MG tablet Take 25 mg by mouth every 6 (six) hours as needed for nausea or vomiting.    [provider]  Rizatriptan Benzoate (MAXALT PO) Take by mouth.    [provider]    Allergies Penicillins   REVIEW OF SYSTEMS    Negative except as noted here or in the History of Present Illness.   PHYSICAL EXAMINATION  Initial Vital Signs Blood pressure 132/86, pulse 75, temperature (!) 97.5 F (36.4 C), temperature source Oral, resp. rate 18, height 5\' 2"  (1.575 m), weight 76.2 kg (168 lb), last menstrual period 01/26/2018, SpO2 100 %.  Examination General: Well-developed, well-nourished female in no acute distress; appearance consistent with age of record HENT: normocephalic; atraumatic Eyes: pupils equal, round and reactive to light; extraocular muscles intact Neck: supple Heart: regular rate and rhythm Lungs: clear to auscultation bilaterally Abdomen: soft; nondistended; nontender; no masses or hepatosplenomegaly; bowel sounds present Extremities: No deformity; full range of motion; pulses normal Neurologic: Awake, alert and oriented; motor function intact in all extremities and symmetric; no facial droop Skin: Warm and dry Psychiatric: Normal mood and affect   RESULTS  Summary of this visit's results, reviewed by myself:   EKG Interpretation  Date/Time:    Ventricular Rate:    PR Interval:    QRS Duration:   QT Interval:    QTC Calculation:   R Axis:     Text Interpretation:        Laboratory Studies: Results for orders placed or performed during the hospital encounter of 02/12/18 (from the past 24 hour(s))  Pregnancy, urine     Status: None   Collection  Time: 02/12/18  5:25 AM  Result Value Ref Range   Preg Test, Ur NEGATIVE NEGATIVE  Urinalysis, Routine w reflex microscopic     Status: Abnormal   Collection Time: 02/12/18  5:25 AM  Result Value Ref Range   Color, Urine YELLOW YELLOW   APPearance CLOUDY (A) CLEAR   Specific Gravity, Urine 1.025 1.005 - 1.030   pH 6.0 5.0 - 8.0   Glucose, UA NEGATIVE NEGATIVE mg/dL   Hgb urine dipstick NEGATIVE NEGATIVE   Bilirubin Urine NEGATIVE NEGATIVE   Ketones, ur NEGATIVE NEGATIVE mg/dL   Protein, ur NEGATIVE NEGATIVE mg/dL   Nitrite  NEGATIVE NEGATIVE   Leukocytes, UA SMALL (A) NEGATIVE  Urinalysis, Microscopic (reflex)     Status: Abnormal   Collection Time: 02/12/18  5:25 AM  Result Value Ref Range   RBC / HPF 0-5 0 - 5 RBC/hpf   WBC, UA 0-5 0 - 5 WBC/hpf   Bacteria, UA MANY (A) NONE SEEN   Squamous Epithelial / LPF 6-30 (A) NONE SEEN   Mucus PRESENT    Granular Casts, UA PRESENT    Imaging Studies: No results found.  ED COURSE  Nursing notes and initial vitals signs, including pulse oximetry, reviewed.  Vitals:   02/12/18 0511 02/12/18 0622  BP: 132/86 131/88  Pulse: 75 75  Resp: 18 16  Temp: (!) 97.5 F (36.4 C)   TempSrc: Oral   SpO2: 100% 97%  Weight: 76.2 kg (168 lb)   Height: 5\' 2"  (1.575 m)    6:30 AM Patient feeling much better.  She is drinking fluids without emesis.  Her initial symptoms suggested an allergic reaction but her itching and redness past without specific treatment.  Her subsequent nausea, vomiting and diarrhea are consistent with a viral gastroenteritis.  She is showing no signs of an allergic reaction at this time.  PROCEDURES    ED DIAGNOSES     ICD-10-CM   1. Nausea, vomiting and diarrhea R11.2    R19.7        Craig Wisnewski, Jonny Ruiz, MD 02/12/18 [phone]

## 2018-02-12 NOTE — ED Triage Notes (Addendum)
Pt arrives from home via EMS with c/o n/v/d, onset this morning around 0415. En route pt BP 130/70, hr 70 NSR, 97% RA, cbg 136. IV established in the left AC, Zofran 4mg  and 500ns bolus given with resolution of nausea. Pt reports she was having body aches and dizziness, feels better now. No fevers that she is aware of. Last nyquil around midnight.

## 2018-02-12 NOTE — ED Notes (Signed)
ED Provider at bedside. 

## 2018-02-12 NOTE — ED Notes (Signed)
Patient given gingerale per order

## 2018-12-07 DIAGNOSIS — J209 Acute bronchitis, unspecified: Secondary | ICD-10-CM | POA: Diagnosis not present

## 2018-12-27 DIAGNOSIS — R05 Cough: Secondary | ICD-10-CM | POA: Diagnosis not present

## 2018-12-27 DIAGNOSIS — L989 Disorder of the skin and subcutaneous tissue, unspecified: Secondary | ICD-10-CM | POA: Diagnosis not present

## 2018-12-30 DIAGNOSIS — L738 Other specified follicular disorders: Secondary | ICD-10-CM | POA: Diagnosis not present

## 2018-12-30 DIAGNOSIS — L448 Other specified papulosquamous disorders: Secondary | ICD-10-CM | POA: Diagnosis not present

## 2018-12-30 DIAGNOSIS — D485 Neoplasm of uncertain behavior of skin: Secondary | ICD-10-CM | POA: Diagnosis not present

## 2018-12-30 DIAGNOSIS — L821 Other seborrheic keratosis: Secondary | ICD-10-CM | POA: Diagnosis not present

## 2019-01-03 DIAGNOSIS — J209 Acute bronchitis, unspecified: Secondary | ICD-10-CM | POA: Diagnosis not present

## 2019-10-14 ENCOUNTER — Other Ambulatory Visit: Payer: Self-pay

## 2019-10-14 DIAGNOSIS — Z20822 Contact with and (suspected) exposure to covid-19: Secondary | ICD-10-CM

## 2019-10-16 LAB — NOVEL CORONAVIRUS, NAA: SARS-CoV-2, NAA: NOT DETECTED

## 2019-11-18 DIAGNOSIS — Z3041 Encounter for surveillance of contraceptive pills: Secondary | ICD-10-CM | POA: Diagnosis not present

## 2019-11-18 DIAGNOSIS — F419 Anxiety disorder, unspecified: Secondary | ICD-10-CM | POA: Diagnosis not present

## 2019-11-18 DIAGNOSIS — K219 Gastro-esophageal reflux disease without esophagitis: Secondary | ICD-10-CM | POA: Diagnosis not present

## 2019-11-18 DIAGNOSIS — G43009 Migraine without aura, not intractable, without status migrainosus: Secondary | ICD-10-CM | POA: Diagnosis not present

## 2020-01-28 DIAGNOSIS — Z23 Encounter for immunization: Secondary | ICD-10-CM | POA: Diagnosis not present

## 2020-02-25 DIAGNOSIS — Z23 Encounter for immunization: Secondary | ICD-10-CM | POA: Diagnosis not present

## 2020-09-29 ENCOUNTER — Encounter (HOSPITAL_COMMUNITY): Payer: Self-pay | Admitting: Cardiology

## 2020-09-29 ENCOUNTER — Inpatient Hospital Stay (HOSPITAL_COMMUNITY): Payer: BC Managed Care – PPO

## 2020-09-29 ENCOUNTER — Inpatient Hospital Stay (HOSPITAL_COMMUNITY)
Admission: EM | Disposition: A | Discharge status: Home / Self Care | Payer: Self-pay | Source: Home / Self Care | Attending: Cardiology

## 2020-09-29 ENCOUNTER — Inpatient Hospital Stay (HOSPITAL_COMMUNITY)
Admission: EM | Admit: 2020-09-29 | Discharge: 2020-10-01 | DRG: 247 | Disposition: A | Discharge status: Home / Self Care | Payer: BC Managed Care – PPO | Attending: Cardiology | Admitting: Cardiology

## 2020-09-29 DIAGNOSIS — R0902 Hypoxemia: Secondary | ICD-10-CM | POA: Diagnosis not present

## 2020-09-29 DIAGNOSIS — B356 Tinea cruris: Secondary | ICD-10-CM | POA: Diagnosis not present

## 2020-09-29 DIAGNOSIS — Z79899 Other long term (current) drug therapy: Secondary | ICD-10-CM

## 2020-09-29 DIAGNOSIS — G43909 Migraine, unspecified, not intractable, without status migrainosus: Secondary | ICD-10-CM | POA: Diagnosis not present

## 2020-09-29 DIAGNOSIS — R030 Elevated blood-pressure reading, without diagnosis of hypertension: Secondary | ICD-10-CM | POA: Clinically undetermined

## 2020-09-29 DIAGNOSIS — R110 Nausea: Secondary | ICD-10-CM | POA: Diagnosis not present

## 2020-09-29 DIAGNOSIS — I493 Ventricular premature depolarization: Secondary | ICD-10-CM | POA: Diagnosis not present

## 2020-09-29 DIAGNOSIS — Z955 Presence of coronary angioplasty implant and graft: Secondary | ICD-10-CM

## 2020-09-29 DIAGNOSIS — D649 Anemia, unspecified: Secondary | ICD-10-CM | POA: Diagnosis not present

## 2020-09-29 DIAGNOSIS — Z880 Allergy status to penicillin: Secondary | ICD-10-CM

## 2020-09-29 DIAGNOSIS — Z9861 Coronary angioplasty status: Secondary | ICD-10-CM

## 2020-09-29 DIAGNOSIS — E785 Hyperlipidemia, unspecified: Secondary | ICD-10-CM | POA: Diagnosis not present

## 2020-09-29 DIAGNOSIS — I25118 Atherosclerotic heart disease of native coronary artery with other forms of angina pectoris: Secondary | ICD-10-CM | POA: Diagnosis not present

## 2020-09-29 DIAGNOSIS — I472 Ventricular tachycardia: Secondary | ICD-10-CM | POA: Diagnosis not present

## 2020-09-29 DIAGNOSIS — R079 Chest pain, unspecified: Secondary | ICD-10-CM | POA: Diagnosis not present

## 2020-09-29 DIAGNOSIS — R0789 Other chest pain: Secondary | ICD-10-CM | POA: Diagnosis not present

## 2020-09-29 DIAGNOSIS — I2510 Atherosclerotic heart disease of native coronary artery without angina pectoris: Secondary | ICD-10-CM

## 2020-09-29 DIAGNOSIS — Z8249 Family history of ischemic heart disease and other diseases of the circulatory system: Secondary | ICD-10-CM | POA: Diagnosis not present

## 2020-09-29 DIAGNOSIS — Z20822 Contact with and (suspected) exposure to covid-19: Secondary | ICD-10-CM | POA: Diagnosis present

## 2020-09-29 DIAGNOSIS — I10 Essential (primary) hypertension: Secondary | ICD-10-CM | POA: Diagnosis not present

## 2020-09-29 DIAGNOSIS — I213 ST elevation (STEMI) myocardial infarction of unspecified site: Secondary | ICD-10-CM | POA: Diagnosis not present

## 2020-09-29 DIAGNOSIS — N3090 Cystitis, unspecified without hematuria: Secondary | ICD-10-CM | POA: Diagnosis not present

## 2020-09-29 DIAGNOSIS — I2129 ST elevation (STEMI) myocardial infarction involving other sites: Principal | ICD-10-CM | POA: Diagnosis present

## 2020-09-29 HISTORY — DX: ST elevation (STEMI) myocardial infarction involving other sites: I21.29

## 2020-09-29 HISTORY — DX: Atherosclerotic heart disease of native coronary artery without angina pectoris: I25.10

## 2020-09-29 HISTORY — PX: CORONARY STENT INTERVENTION: CATH118234

## 2020-09-29 HISTORY — PX: LEFT HEART CATH AND CORONARY ANGIOGRAPHY: CATH118249

## 2020-09-29 HISTORY — PX: CORONARY/GRAFT ACUTE MI REVASCULARIZATION: CATH118305

## 2020-09-29 LAB — COMPREHENSIVE METABOLIC PANEL
ALT: 19 U/L (ref 0–44)
AST: 19 U/L (ref 15–41)
Albumin: 3.4 g/dL — ABNORMAL LOW (ref 3.5–5.0)
Alkaline Phosphatase: 47 U/L (ref 38–126)
Anion gap: 11 (ref 5–15)
BUN: 9 mg/dL (ref 6–20)
CO2: 21 mmol/L — ABNORMAL LOW (ref 22–32)
Calcium: 8.7 mg/dL — ABNORMAL LOW (ref 8.9–10.3)
Chloride: 105 mmol/L (ref 98–111)
Creatinine, Ser: 0.84 mg/dL (ref 0.44–1.00)
GFR, Estimated: >60 mL/min (ref >60)
Glucose, Bld: 122 mg/dL — ABNORMAL HIGH (ref 70–99)
Potassium: 3.7 mmol/L (ref 3.5–5.1)
Sodium: 137 mmol/L (ref 135–145)
Total Bilirubin: 0.4 mg/dL (ref 0.3–1.2)
Total Protein: 6.8 g/dL (ref 6.5–8.1)

## 2020-09-29 LAB — CBC WITH DIFFERENTIAL/PLATELET
Abs Immature Granulocytes: 0.02 10*3/uL (ref 0.00–0.07)
Basophils Absolute: 0 10*3/uL (ref 0.0–0.1)
Basophils Relative: 1 %
Eosinophils Absolute: 0.1 10*3/uL (ref 0.0–0.5)
Eosinophils Relative: 1 %
HCT: 37.6 % (ref 36.0–46.0)
Hemoglobin: 11.8 g/dL — ABNORMAL LOW (ref 12.0–15.0)
Immature Granulocytes: 0 %
Lymphocytes Relative: 29 %
Lymphs Abs: 1.9 10*3/uL (ref 0.7–4.0)
MCH: 29.6 pg (ref 26.0–34.0)
MCHC: 31.4 g/dL (ref 30.0–36.0)
MCV: 94.5 fL (ref 80.0–100.0)
Monocytes Absolute: 0.3 10*3/uL (ref 0.1–1.0)
Monocytes Relative: 5 %
Neutro Abs: 4.1 10*3/uL (ref 1.7–7.7)
Neutrophils Relative %: 64 %
Platelets: 260 10*3/uL (ref 150–400)
RBC: 3.98 MIL/uL (ref 3.87–5.11)
RDW: 12.8 % (ref 11.5–15.5)
WBC: 6.4 10*3/uL (ref 4.0–10.5)
nRBC: 0 % (ref 0.0–0.2)

## 2020-09-29 LAB — LIPID PANEL
Cholesterol: 230 mg/dL — ABNORMAL HIGH (ref 0–200)
HDL: 73 mg/dL (ref >40)
LDL Cholesterol: 135 mg/dL — ABNORMAL HIGH (ref 0–99)
Total CHOL/HDL Ratio: 3.2 RATIO
Triglycerides: 110 mg/dL (ref <150)
VLDL: 22 mg/dL (ref 0–40)

## 2020-09-29 LAB — HEMOGLOBIN A1C
Hgb A1c MFr Bld: 5.4 % (ref 4.8–5.6)
Mean Plasma Glucose: 108.28 mg/dL

## 2020-09-29 LAB — RESPIRATORY PANEL BY RT PCR (FLU A&B, COVID)
Influenza A by PCR: NEGATIVE
Influenza B by PCR: NEGATIVE
SARS Coronavirus 2 by RT PCR: NEGATIVE

## 2020-09-29 LAB — TROPONIN I (HIGH SENSITIVITY)
Troponin I (High Sensitivity): 36 ng/L — ABNORMAL HIGH (ref <18)
Troponin I (High Sensitivity): 13646 ng/L (ref <18)
Troponin I (High Sensitivity): >27000 ng/L (ref <18)

## 2020-09-29 LAB — APTT: aPTT: 109 seconds — ABNORMAL HIGH (ref 24–36)

## 2020-09-29 LAB — PROTIME-INR
INR: 1.2 (ref 0.8–1.2)
Prothrombin Time: 14.3 seconds (ref 11.4–15.2)

## 2020-09-29 LAB — MRSA PCR SCREENING: MRSA by PCR: NEGATIVE

## 2020-09-29 SURGERY — CORONARY/GRAFT ACUTE MI REVASCULARIZATION
Anesthesia: LOCAL

## 2020-09-29 MED ORDER — SODIUM CHLORIDE 0.9 % IV SOLN: INTRAVENOUS | Status: DC

## 2020-09-29 MED ORDER — IOHEXOL 350 MG/ML SOLN
INTRAVENOUS | Status: AC
  Filled 2020-09-29: qty 1

## 2020-09-29 MED ORDER — HEPARIN (PORCINE) IN NACL 1000-0.9 UT/500ML-% IV SOLN
INTRAVENOUS | Status: DC | PRN
  Administered 2020-09-29 (×2): 500 mL

## 2020-09-29 MED ORDER — ONDANSETRON 4 MG PO TBDP
8.0000 mg | ORAL_TABLET | Freq: Three times a day (TID) | ORAL | Status: DC | PRN
  Filled 2020-09-29: qty 2

## 2020-09-29 MED ORDER — SODIUM CHLORIDE 0.9% FLUSH: 3.0000 mL | INTRAVENOUS | Status: DC | PRN

## 2020-09-29 MED ORDER — TICAGRELOR 90 MG PO TABS
ORAL_TABLET | ORAL | Status: DC | PRN
  Administered 2020-09-29: 180 mg via ORAL

## 2020-09-29 MED ORDER — HEPARIN (PORCINE) IN NACL 1000-0.9 UT/500ML-% IV SOLN
INTRAVENOUS | Status: AC
  Filled 2020-09-29: qty 1000

## 2020-09-29 MED ORDER — MIDAZOLAM HCL 2 MG/2ML IJ SOLN
INTRAMUSCULAR | Status: DC | PRN
  Administered 2020-09-29 (×2): 1 mg via INTRAVENOUS
  Administered 2020-09-29: 2 mg via INTRAVENOUS

## 2020-09-29 MED ORDER — MORPHINE SULFATE (PF) 2 MG/ML IV SOLN
2.0000 mg | INTRAVENOUS | Status: DC | PRN
  Administered 2020-09-29 – 2020-09-30 (×2): 2 mg via INTRAVENOUS
  Filled 2020-09-29 (×2): qty 1

## 2020-09-29 MED ORDER — MIDAZOLAM HCL 2 MG/2ML IJ SOLN
INTRAMUSCULAR | Status: AC
  Filled 2020-09-29: qty 2

## 2020-09-29 MED ORDER — FENTANYL CITRATE (PF) 100 MCG/2ML IJ SOLN
INTRAMUSCULAR | Status: AC
  Filled 2020-09-29: qty 2

## 2020-09-29 MED ORDER — ONDANSETRON HCL 4 MG/2ML IJ SOLN
4.0000 mg | Freq: Four times a day (QID) | INTRAMUSCULAR | Status: DC | PRN
  Administered 2020-09-29 – 2020-09-30 (×2): 4 mg via INTRAVENOUS
  Filled 2020-09-29 (×2): qty 2

## 2020-09-29 MED ORDER — LABETALOL HCL 5 MG/ML IV SOLN
10.0000 mg | INTRAVENOUS | Status: AC | PRN
  Administered 2020-09-29: 10 mg via INTRAVENOUS
  Filled 2020-09-29: qty 4

## 2020-09-29 MED ORDER — ASPIRIN 81 MG PO CHEW
81.0000 mg | CHEWABLE_TABLET | Freq: Every day | ORAL | Status: DC
  Administered 2020-09-30 – 2020-10-01 (×2): 81 mg via ORAL
  Filled 2020-09-29 (×2): qty 1

## 2020-09-29 MED ORDER — SODIUM CHLORIDE 0.9% FLUSH
3.0000 mL | Freq: Two times a day (BID) | INTRAVENOUS | Status: DC
  Administered 2020-09-29 – 2020-10-01 (×4): 3 mL via INTRAVENOUS

## 2020-09-29 MED ORDER — LIDOCAINE HCL (PF) 1 % IJ SOLN
INTRAMUSCULAR | Status: DC | PRN
  Administered 2020-09-29: 2 mL

## 2020-09-29 MED ORDER — LIDOCAINE HCL (PF) 1 % IJ SOLN
INTRAMUSCULAR | Status: AC
  Filled 2020-09-29: qty 30

## 2020-09-29 MED ORDER — SODIUM CHLORIDE 0.9 % IV SOLN: 250.0000 mL | INTRAVENOUS | Status: DC | PRN

## 2020-09-29 MED ORDER — HEPARIN SODIUM (PORCINE) 5000 UNIT/ML IJ SOLN: 60.0000 [IU]/kg | Freq: Once | INTRAMUSCULAR | Status: DC

## 2020-09-29 MED ORDER — IOHEXOL 350 MG/ML SOLN
INTRAVENOUS | Status: DC | PRN
  Administered 2020-09-29: 150 mL

## 2020-09-29 MED ORDER — NITROGLYCERIN 1 MG/10 ML FOR IR/CATH LAB
INTRA_ARTERIAL | Status: AC
  Filled 2020-09-29: qty 10

## 2020-09-29 MED ORDER — HYDRALAZINE HCL 20 MG/ML IJ SOLN
10.0000 mg | INTRAMUSCULAR | Status: AC | PRN
  Filled 2020-09-29: qty 1

## 2020-09-29 MED ORDER — VERAPAMIL HCL 2.5 MG/ML IV SOLN
INTRAVENOUS | Status: DC | PRN
  Administered 2020-09-29: 10 mL via INTRA_ARTERIAL

## 2020-09-29 MED ORDER — ATORVASTATIN CALCIUM 80 MG PO TABS
80.0000 mg | ORAL_TABLET | Freq: Every day | ORAL | Status: DC
  Administered 2020-09-29 – 2020-10-01 (×3): 80 mg via ORAL
  Filled 2020-09-29 (×3): qty 1

## 2020-09-29 MED ORDER — HEPARIN (PORCINE) 25000 UT/250ML-% IV SOLN: 1000.0000 [IU]/h | INTRAVENOUS | Status: DC

## 2020-09-29 MED ORDER — METOCLOPRAMIDE HCL 5 MG/ML IJ SOLN
5.0000 mg | Freq: Four times a day (QID) | INTRAMUSCULAR | Status: DC | PRN
  Administered 2020-09-30: 5 mg via INTRAVENOUS
  Filled 2020-09-29: qty 2

## 2020-09-29 MED ORDER — TICAGRELOR 90 MG PO TABS
90.0000 mg | ORAL_TABLET | Freq: Two times a day (BID) | ORAL | Status: DC
  Administered 2020-09-29 – 2020-10-01 (×4): 90 mg via ORAL
  Filled 2020-09-29 (×4): qty 1

## 2020-09-29 MED ORDER — HEPARIN SODIUM (PORCINE) 5000 UNIT/ML IJ SOLN
4000.0000 [IU] | Freq: Once | INTRAMUSCULAR | Status: AC
  Administered 2020-09-29: 4000 [IU] via INTRAVENOUS
  Filled 2020-09-29: qty 0.8

## 2020-09-29 MED ORDER — HEPARIN SODIUM (PORCINE) 1000 UNIT/ML IJ SOLN
INTRAMUSCULAR | Status: DC | PRN
  Administered 2020-09-29: 2000 [IU] via INTRAVENOUS
  Administered 2020-09-29: 6000 [IU] via INTRAVENOUS

## 2020-09-29 MED ORDER — ACETAMINOPHEN 325 MG PO TABS: 650.0000 mg | ORAL_TABLET | ORAL | Status: DC | PRN

## 2020-09-29 MED ORDER — FENTANYL CITRATE (PF) 100 MCG/2ML IJ SOLN
INTRAMUSCULAR | Status: DC | PRN
  Administered 2020-09-29: 50 ug via INTRAVENOUS
  Administered 2020-09-29 (×2): 25 ug via INTRAVENOUS

## 2020-09-29 MED ORDER — CARVEDILOL 3.125 MG PO TABS
3.1250 mg | ORAL_TABLET | Freq: Two times a day (BID) | ORAL | Status: DC
  Administered 2020-09-29 – 2020-09-30 (×2): 3.125 mg via ORAL
  Filled 2020-09-29 (×2): qty 1

## 2020-09-29 MED ORDER — VERAPAMIL HCL 2.5 MG/ML IV SOLN
INTRAVENOUS | Status: AC
  Filled 2020-09-29: qty 2

## 2020-09-29 MED ORDER — TICAGRELOR 90 MG PO TABS
ORAL_TABLET | ORAL | Status: AC
  Filled 2020-09-29: qty 2

## 2020-09-29 MED ORDER — SODIUM CHLORIDE 0.9 % IV SOLN: INTRAVENOUS | Status: AC

## 2020-09-29 SURGICAL SUPPLY — 17 items
BALLN SAPPHIRE 2.0X10 (BALLOONS) ×2
BALLN SAPPHIRE ~~LOC~~ 2.25X12 (BALLOONS) ×2 IMPLANT
BALLOON SAPPHIRE 2.0X10 (BALLOONS) ×1 IMPLANT
CATH INFINITI 5FR ANG PIGTAIL (CATHETERS) ×2 IMPLANT
CATH OPTITORQUE TIG 4.0 5F (CATHETERS) ×2 IMPLANT
CATH VISTA GUIDE 6FR XBLAD3.0 (CATHETERS) ×2 IMPLANT
DEVICE RAD COMP TR BAND LRG (VASCULAR PRODUCTS) ×2 IMPLANT
GLIDESHEATH SLEND SS 6F .021 (SHEATH) ×2 IMPLANT
GUIDEWIRE INQWIRE 1.5J.035X260 (WIRE) ×1 IMPLANT
INQWIRE 1.5J .035X260CM (WIRE) ×2
KIT ENCORE 26 ADVANTAGE (KITS) ×2 IMPLANT
KIT HEART LEFT (KITS) ×2 IMPLANT
PACK CARDIAC CATHETERIZATION (CUSTOM PROCEDURE TRAY) ×2 IMPLANT
STENT RESOLUTE ONYX 2.0X15 (Permanent Stent) ×2 IMPLANT
TRANSDUCER W/STOPCOCK (MISCELLANEOUS) ×2 IMPLANT
TUBING CIL FLEX 10 FLL-RA (TUBING) ×2 IMPLANT
WIRE ASAHI PROWATER 180CM (WIRE) ×2 IMPLANT

## 2020-09-29 NOTE — ED Provider Notes (Signed)
[facility] EMERGENCY DEPARTMENT Provider Note   CSN: [id number] Arrival date & time: 09/29/20  1133     History Chief Complaint  Patient presents with  . Code STEMI    Renee Fitzgerald is a 44 y.o. female.   Chest Pain Pain location:  Substernal area Pain quality: pressure   Pain severity:  Severe Onset quality:  Sudden Timing:  Constant Progression:  Worsening Chronicity:  New Context comment:  Working on a farm Relieved by:  Nitroglycerin Worsened by:  Nothing Ineffective treatments:  None tried Associated symptoms: diaphoresis and shortness of breath   Associated symptoms: no back pain, no cough, no fever, no headache, no nausea, no palpitations and no vomiting        Past Medical History:  Diagnosis Date  . Anemia   . CAD S/P PCI of 100% ost-prox RI 09/29/2020   Lateral STEMI -> 100% ost-prox Diag - Resolute Onyx DES 2.0 x 15 mm - 2.3 mm  . Migraine   . ST elevation myocardial infarction (STEMI) of lateral wall (HCC) 09/29/2020   RI 100% - DES PCI.     Patient Active Problem List   Diagnosis Date Noted  . Acute ST elevation myocardial infarction (STEMI) of lateral wall (HCC) 09/29/2020  . CAD S/P PCI of 100% ost-prox RI 09/29/2020    Past Surgical History:  Procedure Laterality Date  . WISDOM TOOTH EXTRACTION       OB History   No obstetric history on file.     Family History  Problem Relation Age of Onset  . Healthy Mother   . Healthy Father   . Heart attack Paternal Grandfather 50  . CAD Paternal Grandfather 14    Social History   Tobacco Use  . Smoking status: Never Smoker  . Smokeless tobacco: Never Used  Substance Use Topics  . Alcohol use: No  . Drug use: No    Home Medications Prior to Admission medications   Medication Sig Start Date End Date Taking? Authorizing Provider  ALTAVERA 0.15-30 MG-MCG tablet Take 1 tablet by mouth daily. 08/17/20   [provider]  Levonorgestrel-Ethinyl Estrad  (MARLISSA PO) Take by mouth.    [provider]  omeprazole (PRILOSEC) 20 MG capsule Take 20 mg by mouth daily. 07/20/20   [provider]  ondansetron (ZOFRAN ODT) 8 MG disintegrating tablet Take 1 tablet (8 mg total) by mouth every 8 (eight) hours as needed for nausea or vomiting. 02/12/18   Molpus, John, MD  rizatriptan (MAXALT-MLT) 10 MG disintegrating tablet Take 10 mg by mouth every 2 (two) hours as needed for headache. 08/31/20   [provider]  Rizatriptan Benzoate (MAXALT PO) Take by mouth.    [provider]  zolpidem (AMBIEN) 10 MG tablet Take 10 mg by mouth at bedtime as needed for sleep. 09/10/20   [provider]    Allergies    Penicillins  Review of Systems   Review of Systems  Constitutional: Positive for diaphoresis. Negative for chills and fever.  HENT: Negative for congestion and rhinorrhea.   Respiratory: Positive for shortness of breath. Negative for cough.   Cardiovascular: Positive for chest pain. Negative for palpitations.  Gastrointestinal: Negative for diarrhea, nausea and vomiting.  Genitourinary: Negative for difficulty urinating and dysuria.  Musculoskeletal: Negative for arthralgias and back pain.  Skin: Negative for rash and wound.  Neurological: Negative for light-headedness and headaches.    Physical Exam Updated Vital Signs BP 130/73   Resp 14  Ht 5\' 2"  (1.575 m)   Wt 77.1 kg   SpO2 100%   BMI 31.09 kg/m   Physical Exam Vitals and nursing note reviewed. Exam conducted with a chaperone present.  Constitutional:      General: She is not in acute distress.    Appearance: Normal appearance.  HENT:     Head: Normocephalic and atraumatic.     Nose: No rhinorrhea.  Eyes:     General:        Right eye: No discharge.        Left eye: No discharge.     Conjunctiva/sclera: Conjunctivae normal.  Cardiovascular:     Rate and Rhythm: Normal rate and regular rhythm.     Heart sounds: No murmur heard.    Pulmonary:     Effort: Pulmonary effort is normal. No respiratory distress.     Breath sounds: No stridor.  Abdominal:     General: Abdomen is flat. There is no distension.     Palpations: Abdomen is soft.     Tenderness: There is no abdominal tenderness.  Musculoskeletal:        General: No tenderness or signs of injury.     Right lower leg: No edema.     Left lower leg: No edema.  Skin:    General: Skin is warm and dry.  Neurological:     General: No focal deficit present.     Mental Status: She is alert. Mental status is at baseline.     Motor: No weakness.  Psychiatric:        Mood and Affect: Mood normal.        Behavior: Behavior normal.     ED Results / Procedures / Treatments   Labs (all labs ordered are listed, but only abnormal results are displayed) Labs Reviewed  CBC WITH DIFFERENTIAL/PLATELET - Abnormal; Notable for the following components:      Result Value   Hemoglobin 11.8 (*)    All other components within normal limits  APTT - Abnormal; Notable for the following components:   aPTT 109 (*)    All other components within normal limits  COMPREHENSIVE METABOLIC PANEL - Abnormal; Notable for the following components:   CO2 21 (*)    Glucose, Bld 122 (*)    Calcium 8.7 (*)    Albumin 3.4 (*)    All other components within normal limits  LIPID PANEL - Abnormal; Notable for the following components:   Cholesterol 230 (*)    LDL Cholesterol 135 (*)    All other components within normal limits  TROPONIN I (HIGH SENSITIVITY) - Abnormal; Notable for the following components:   Troponin I (High Sensitivity) 36 (*)    All other components within normal limits  RESPIRATORY PANEL BY RT PCR (FLU A&B, COVID)  HEMOGLOBIN A1C  PROTIME-INR  I-STAT BETA HCG BLOOD, ED ([facility], [facility], [facility] ONLY)  TROPONIN I (HIGH SENSITIVITY)    EKG None  Radiology CARDIAC CATHETERIZATION  Result Date: 09/29/2020  The left ventricular systolic function is normal. The left  ventricular ejection fraction is 55-65% by visual estimate -suggestion of anterolateral hypokinesis  LV end diastolic pressure is normal. No MR or AI  ----------------------------------------  CULPRIT LESION: Ramus lesion is 100% stenosed.  A drug-eluting stent was successfully placed using a STENT RESOLUTE ONYX 2.0X15. Postdilated to 2.3 mm  Post intervention, there is a 0% residual stenosis.  Dist LAD-1 lesion is 30% stenosed. Dist LAD-2 lesion is 45% stenosed. With diffuse mild-mild  to moderate disease throughout the RCA and LCx stent.  SUMMARY  Severe single-vessel disease with 100% occluded ostial and proximal Ramus Intermedius with otherwise mild to moderate disease elsewhere (mostly mild but moderate in the distal LAD)  Successful DES PCI of Ramus Intermedius using resolute Onyx DES 2.0 mm x 15 mm postdilated to 2.3 mm.  Normal LVEDP with mild lateral hypokinesis otherwise preserved EF. RECOMMENDATIONS  Has admit orders to the ICU, however could be transferred to 6E or 2C stepdown units if ICU beds become urgent  Check 2D echo tomorrow  Guideline Directed Medical Therapy: Start low-dose carvedilol, high-dose high intensity statin along with DAPT.  Expect that she can probably be Fast-Track discharge on 11/22. Bryan Lemma, MD   Procedures .Critical Care Performed by: Sabino Donovan, MD Authorized by: Sabino Donovan, MD   Critical care provider statement:    Critical care time (minutes):  45   Critical care was necessary to treat or prevent imminent or life-threatening deterioration of the following conditions:  Cardiac failure   Critical care was time spent personally by me on the following activities:  Discussions with consultants, evaluation of patient's response to treatment, examination of patient, ordering and performing treatments and interventions, ordering and review of laboratory studies, ordering and review of radiographic studies, pulse oximetry, re-evaluation of patient's  condition, obtaining history from patient or surrogate, review of old charts, blood draw for specimens and development of treatment plan with patient or surrogate   (including critical care time)  Medications Ordered in ED Medications  0.9 %  sodium chloride infusion (has no administration in time range)  heparin injection 4,000 Units (4,000 Units Intravenous Given 09/29/20 1135)    ED Course  I have reviewed the triage vital signs and the nursing notes.  Pertinent labs & imaging results that were available during my care of the patient were reviewed by me and considered in my medical decision making (see chart for details).    MDM Rules/Calculators/A&P                          Code STEMI called in the field based on symptoms and EKG findings with mild ST elevation in aVL and 1, reciprocal change seen in inferior leads.  Nitroglycerin given initially, a symptom relief but then hypotension that responded well to IV fluids.  Upon arrival her airway is intact for bilateral breath sounds and was hemodynamically stable.  Cardiology was at bedside.  Heparin bolus was given starting infusion.  Aspirin was given.  The cardiologist agreed based on repeat EKG that showed worsening ST elevation the patient needs to emergently go to the Cath Lab.  Other orders were placed at the time to evaluate the labs and imaging was not adequate here in the emergency department will need follow-up as part of the inpatient team.  CRITICAL CARE Performed by: Sabino Donovan   Total critical care time: 45 minutes  Critical care time was exclusive of separately billable procedures and treating other patients.  Critical care was necessary to treat or prevent imminent or life-threatening deterioration.  Critical care was time spent personally by me on the following activities: development of treatment plan with patient and/or surrogate as well as nursing, discussions with consultants, evaluation of patient's response to  treatment, examination of patient, obtaining history from patient or surrogate, ordering and performing treatments and interventions, ordering and review of laboratory studies, ordering and review of radiographic studies, pulse oximetry  and re-evaluation of patient's condition.  Final Clinical Impression(s) / ED Diagnoses Final diagnoses:  ST elevation myocardial infarction (STEMI) involving other coronary artery [facility])    Rx / DC Orders ED Discharge Orders    None       Sabino Donovan, MD 09/29/20 [phone]

## 2020-09-29 NOTE — Discharge Instructions (Signed)

## 2020-09-29 NOTE — H&P (Signed)
Cardiology Admission History and Physical:   Patient ID: Renee Fitzgerald MRN: [id number]; DOB: 1976/07/28   Admission date: 09/29/2020  Primary Care Provider: Ursula Beath, MD [facility] Cardiologist: No primary care provider on file.  [facility] Electrophysiologist:  None   Chief Complaint: Chest pain  Patient Profile:   Renee Fitzgerald is a 44 y.o. female with no significant prior PMH presented with acute onset chest pain called code STEMI by EMS for subtle ST elevations in lead I and aVL -> progression of the next 2 EKGs showed worsening ST elevations.  Upon arrival to ED, confirmed convex ST elevations in same leads, with ST depressions.  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  She was raising her arms up to place a heavy bucket of hot water on [place], she started feeling chest discomfort states with nausea.  She thought was just that she was hot so she walked outside continued having chest discomfort so she went to lie down, she started having nausea with dry heaves but no emesis.  Pain continued and therefore she contacted EMS.  Upon arrival EMS EKG was concerning second EKG showed more concern for concave ST elevations in lateral leads.  Code STEMI was called.   Significant 8-9/10 chest pain upon arrival to ER.  Follow-up EKG showed worsening convex affiliations.  Past Medical History:  Diagnosis Date  . Anemia   . Migraine     Past Surgical History:  Procedure Laterality Date  . WISDOM TOOTH EXTRACTION       Medications Prior to Admission: Prior to Admission medications   Medication Sig Start Date End Date Taking? Authorizing Provider  Levonorgestrel-Ethinyl Estrad (MARLISSA PO) Take by mouth.    [provider]  ondansetron (ZOFRAN ODT) 8 MG disintegrating tablet Take 1 tablet (8 mg total) by mouth every 8 (eight) hours as  needed for nausea or vomiting. 02/12/18   Molpus, John, MD  Rizatriptan Benzoate (MAXALT PO) Take by mouth.    [provider]     Allergies:    Allergies  Allergen Reactions  . Penicillins Rash    Social History:   Social History   Socioeconomic History  . Marital status: Married    Spouse name: Not on file  . Number of children: Not on file  . Years of education: Not on file  . Highest education level: Not on file  Occupational History  . Not on file  Tobacco Use  . Smoking status: Never Smoker  . Smokeless tobacco: Never Used  Substance and Sexual Activity  . Alcohol use: No  . Drug use: No  . Sexual activity: Not on file  Other Topics Concern  . Not on file  Social History Narrative  . Not on file   Social Determinants of Health   Financial Resource Strain:   . Difficulty of Paying Living Expenses: Not on file  Food Insecurity:   . Worried About Programme researcher, broadcasting/film/video in the Last Year: Not on file  . Ran Out of Food in the Last Year: Not on file  Transportation Needs:   . Lack of Transportation (Medical): Not on file  . Lack of Transportation (Non-Medical): Not on file  Physical Activity:   . Days of Exercise per Week: Not on file  . Minutes of Exercise per Session: Not on file  Stress:   .  Feeling of Stress : Not on file  Social Connections:   . Frequency of Communication with Friends and Family: Not on file  . Frequency of Social Gatherings with Friends and Family: Not on file  . Attends Religious Services: Not on file  . Active Member of Clubs or Organizations: Not on file  . Attends Banker Meetings: Not on file  . Marital Status: Not on file  Intimate Partner Violence:   . Fear of Current or Ex-Partner: Not on file  . Emotionally Abused: Not on file  . Physically Abused: Not on file  . Sexually Abused: Not on file    Family History: Paternal grandfather had early onset CAD at age 7.  Otherwise no skin disease. The patient's  family history is not on file.    ROS:  She noted having been feeling a little bit congested with a mild URI symptom over the last couple days, but no other symptoms.  She did have nausea today. Please see the history of present illness.  All other ROS reviewed and negative.     Physical Exam/Data:  There were no vitals filed for this visit. No intake or output data in the 24 hours ending 09/29/20 1149 Last 3 Weights 02/12/2018 08/05/2017 05/25/2016  Weight (lbs) 168 lb 182 lb 170 lb  Weight (kg) 76.204 kg 82.555 kg 77.111 kg     There is no height or weight on file to calculate BMI.  General:  Well nourished, well developed, in no moderate distress; mildly diaphoretic HEENT: normal Lymph: no adenopathy Neck: no bruit JVD Endocrine:  No thryomegaly Vascular: No carotid bruits; FA pulses 2+ bilaterally without bruits  Cardiac:  normal S1, S2; RRR; no murmur rubs or gallops Lungs:  clear to auscultation bilaterally, no wheezing, rhonchi or rales  Abd: soft, nontender, no hepatomegaly  Ext: no edema Musculoskeletal:  No deformities, BUE and BLE strength normal and equal Skin: warm and dry  Neuro:  CNs 2-12 intact, no focal abnormalities noted Psych:  Normal affect    EKG:  The ECG that was done upon arrival to ER confirmed roughly 2 mm elations convex in lead I and aVL with inferior depressions.  EKG was personally reviewed -sinus rhythm  Relevant CV Studies: None  Laboratory Data:  High Sensitivity Troponin:  No results for input(s): TROPONINIHS in the last 720 hours.    ChemistryNo results for input(s): NA, K, CL, CO2, GLUCOSE, BUN, CREATININE, CALCIUM, GFRNONAA, GFRAA, ANIONGAP in the last 168 hours.  No results for input(s): PROT, ALBUMIN, AST, ALT, ALKPHOS, BILITOT in the last 168 hours. HematologyNo results for input(s): WBC, RBC, HGB, HCT, MCV, MCH, MCHC, RDW, PLT in the last 168 hours. BNPNo results for input(s): BNP, PROBNP in the last 168 hours.  DDimer No results for  input(s): DDIMER in the last 168 hours.   Radiology/Studies:  No results found.   Assessment and Plan:   1. Acute lateral STEMI. --> Plan urgent cardiac catheterization.  Will likely end up on DAPT plus high-dose statin.  Start beta-blocker but will need to determine blood pressures post PCI.      TIMI Risk Score for ST  Elevation MI:   The patient's TIMI risk score is 1, which indicates a 1.6% risk of all cause mortality at 30 days.       Severity of Illness: The appropriate patient status for this patient is INPATIENT. Inpatient status is judged to be reasonable and necessary in order to provide the required intensity  of service to ensure the patient's safety. The patient's presenting symptoms, physical exam findings, and initial radiographic and laboratory data in the context of their chronic comorbidities is felt to place them at high risk for further clinical deterioration. Furthermore, it is not anticipated that the patient will be medically stable for discharge from the hospital within 2 midnights of admission. The following factors support the patient status of inpatient.   " The patient's presenting symptoms include acute onset chest pain. " The worrisome physical exam findings include mild diaphoresis. " The initial radiographic and laboratory data are worrisome because of EKG with lateral ST elevations. " The chronic co-morbidities include none, family history of premature CAD in paternal grandfather..   * I certify that at the point of admission it is my clinical judgment that the patient will require inpatient hospital care spanning beyond 2 midnights from the point of admission due to high intensity of service, high risk for further deterioration and high frequency of surveillance required.*    For questions or updates, please contact [facility] Please consult [email or web address] for contact info under     Signed, Bryan Lemma, MD  09/29/2020 11:49 AM

## 2020-09-29 NOTE — ED Notes (Signed)
Pt's Mother in [facility] waiting room-- updated on status of transfer to cath lab-

## 2020-09-29 NOTE — ED Triage Notes (Signed)
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    Received NTG x 2, Zofran 4mg , ASA 324mg  enroute.  IV 20g in LAC per EMS-  Tearful on arrival   Heparin 4000U bolus given at 1135.  IV started in right [facility] on arrival

## 2020-09-30 ENCOUNTER — Encounter (HOSPITAL_COMMUNITY): Payer: Self-pay | Admitting: Cardiology

## 2020-09-30 ENCOUNTER — Other Ambulatory Visit: Payer: Self-pay

## 2020-09-30 ENCOUNTER — Inpatient Hospital Stay (HOSPITAL_COMMUNITY): Payer: BC Managed Care – PPO

## 2020-09-30 DIAGNOSIS — I2129 ST elevation (STEMI) myocardial infarction involving other sites: Secondary | ICD-10-CM | POA: Diagnosis not present

## 2020-09-30 DIAGNOSIS — Z9861 Coronary angioplasty status: Secondary | ICD-10-CM | POA: Diagnosis not present

## 2020-09-30 DIAGNOSIS — I2510 Atherosclerotic heart disease of native coronary artery without angina pectoris: Secondary | ICD-10-CM

## 2020-09-30 HISTORY — PX: TRANSTHORACIC ECHOCARDIOGRAM: SHX275

## 2020-09-30 LAB — CBC
HCT: 39.6 % (ref 36.0–46.0)
Hemoglobin: 12.8 g/dL (ref 12.0–15.0)
MCH: 29.6 pg (ref 26.0–34.0)
MCHC: 32.3 g/dL (ref 30.0–36.0)
MCV: 91.7 fL (ref 80.0–100.0)
Platelets: 283 10*3/uL (ref 150–400)
RBC: 4.32 MIL/uL (ref 3.87–5.11)
RDW: 13 % (ref 11.5–15.5)
WBC: 10 10*3/uL (ref 4.0–10.5)
nRBC: 0 % (ref 0.0–0.2)

## 2020-09-30 LAB — BASIC METABOLIC PANEL
Anion gap: 14 (ref 5–15)
BUN: 6 mg/dL (ref 6–20)
CO2: 19 mmol/L — ABNORMAL LOW (ref 22–32)
Calcium: 9 mg/dL (ref 8.9–10.3)
Chloride: 103 mmol/L (ref 98–111)
Creatinine, Ser: 0.81 mg/dL (ref 0.44–1.00)
GFR, Estimated: >60 mL/min (ref >60)
Glucose, Bld: 111 mg/dL — ABNORMAL HIGH (ref 70–99)
Potassium: 3.9 mmol/L (ref 3.5–5.1)
Sodium: 136 mmol/L (ref 135–145)

## 2020-09-30 LAB — TROPONIN I (HIGH SENSITIVITY)
Troponin I (High Sensitivity): >27000 ng/L (ref <18)
Troponin I (High Sensitivity): 23293 ng/L (ref <18)

## 2020-09-30 LAB — ECHOCARDIOGRAM COMPLETE
Area-P 1/2: 3.42 cm2
Height: 62 in
S' Lateral: 2.7 cm
Weight: 2720 oz

## 2020-09-30 LAB — MAGNESIUM: Magnesium: 2 mg/dL (ref 1.7–2.4)

## 2020-09-30 LAB — LIPID PANEL
Cholesterol: 262 mg/dL — ABNORMAL HIGH (ref 0–200)
HDL: 85 mg/dL (ref >40)
LDL Cholesterol: 159 mg/dL — ABNORMAL HIGH (ref 0–99)
Total CHOL/HDL Ratio: 3.1 RATIO
Triglycerides: 90 mg/dL (ref <150)
VLDL: 18 mg/dL (ref 0–40)

## 2020-09-30 LAB — HEMOGLOBIN A1C
Hgb A1c MFr Bld: 5.4 % (ref 4.8–5.6)
Mean Plasma Glucose: 108.28 mg/dL

## 2020-09-30 MED ORDER — CARVEDILOL 6.25 MG PO TABS
6.2500 mg | ORAL_TABLET | Freq: Two times a day (BID) | ORAL | Status: DC
  Administered 2020-09-30 – 2020-10-01 (×2): 6.25 mg via ORAL
  Filled 2020-09-30 (×2): qty 1

## 2020-09-30 MED ORDER — CHLORHEXIDINE GLUCONATE CLOTH 2 % EX PADS
6.0000 | MEDICATED_PAD | Freq: Every day | CUTANEOUS | Status: DC
  Administered 2020-09-30 – 2020-10-01 (×2): 6 via TOPICAL

## 2020-09-30 MED ORDER — METOCLOPRAMIDE HCL 5 MG/ML IJ SOLN: 5.0000 mg | Freq: Four times a day (QID) | INTRAMUSCULAR | Status: DC | PRN

## 2020-09-30 NOTE — Care Management (Signed)
09-30-20 1220 Benefits check submitted for [facility]. Case Manager will follow for cost. Graves-Bigelow, Lamar Laundry, RN,BSN Case Manager

## 2020-09-30 NOTE — Plan of Care (Signed)

## 2020-09-30 NOTE — Progress Notes (Signed)
Progress Note  Patient Name: Renee Fitzgerald Date of Encounter: 09/30/2020  Primary Cardiologist: No primary care provider on file.   Subjective   NAE after cath. Nauseous this AM. Chest pain is completely resolved. Some ventricular ectopy overnight, max 11 beat run of NSVT.  Inpatient Medications    Scheduled Meds: . aspirin  81 mg Oral Daily  . atorvastatin  80 mg Oral Daily  . carvedilol  3.125 mg Oral BID WC  . Chlorhexidine Gluconate Cloth  6 each Topical Daily  . sodium chloride flush  3 mL Intravenous Q12H  . ticagrelor  90 mg Oral BID   Continuous Infusions: . sodium chloride 10 mL/hr at 09/30/20 0800  . sodium chloride     PRN Meds: sodium chloride, acetaminophen, metoCLOPramide (REGLAN) injection, morphine injection, ondansetron (ZOFRAN) IV, ondansetron, sodium chloride flush   Vital Signs    Vitals:   09/30/20 0700 09/30/20 0800 09/30/20 0810 09/30/20 0913  BP: (!) 137/91 (!) 143/85  (!) 148/111  Pulse: 84 74  80  Resp: 12 13    Temp:   98 F (36.7 C)   TempSrc:      SpO2: 96% 97%    Weight:      Height:        Intake/Output Summary (Last 24 hours) at 09/30/2020 1012 Last data filed at 09/30/2020 0800 Gross per 24 hour  Intake 745.51 ml  Output --  Net 745.51 ml   Filed Weights   09/29/20 1157  Weight: 77.1 kg    Telemetry    Sinus rhythm with PVCs and NSVT. Longest episode of NSVT lasting 11 beats. - Personally Reviewed  ECG    Sinus rhythm. No st/t changes c/w ischemia. - Personally Reviewed  Physical Exam   GEN: Uncomfortable appearing. Neck: No JVD Cardiac: RRR, no murmurs, rubs, or gallops.  Respiratory: Clear to auscultation bilaterally. GI: Soft, nontender, non-distended  MS: No edema; No deformity. Neuro:  Nonfocal  Psych: Normal affect   Labs    Chemistry Recent Labs  Lab 09/29/20 1145 09/30/20 0103  NA 137 136  K 3.7 3.9  CL 105 103  CO2 21* 19*  GLUCOSE 122* 111*  BUN 9 6  CREATININE 0.84 0.81   CALCIUM 8.7* 9.0  PROT 6.8  --   ALBUMIN 3.4*  --   AST 19  --   ALT 19  --   ALKPHOS 47  --   BILITOT 0.4  --   GFRNONAA >60 >60  ANIONGAP 11 14     Hematology Recent Labs  Lab 09/29/20 1145 09/30/20 0103  WBC 6.4 10.0  RBC 3.98 4.32  HGB 11.8* 12.8  HCT 37.6 39.6  MCV 94.5 91.7  MCH 29.6 29.6  MCHC 31.4 32.3  RDW 12.8 13.0  PLT 260 283    Cardiac EnzymesNo results for input(s): TROPONINI in the last 168 hours. No results for input(s): TROPIPOC in the last 168 hours.   BNPNo results for input(s): BNP, PROBNP in the last 168 hours.   DDimer No results for input(s): DDIMER in the last 168 hours.   Radiology    CARDIAC CATHETERIZATION  Result Date: 09/29/2020  The left ventricular systolic function is normal. The left ventricular ejection fraction is 55-65% by visual estimate -suggestion of anterolateral hypokinesis  LV end diastolic pressure is normal. No MR or AI  ----------------------------------------  CULPRIT LESION: Ramus lesion is 100% stenosed.  A drug-eluting stent was successfully placed using a STENT RESOLUTE ONYX 2.0X15. Postdilated  to 2.3 mm  Post intervention, there is a 0% residual stenosis.  Dist LAD-1 lesion is 30% stenosed. Dist LAD-2 lesion is 45% stenosed. With diffuse mild-mild to moderate disease throughout the RCA and LCx stent.  SUMMARY  Severe single-vessel disease with 100% occluded ostial and proximal Ramus Intermedius with otherwise mild to moderate disease elsewhere (mostly mild but moderate in the distal LAD)  Successful DES PCI of Ramus Intermedius using resolute Onyx DES 2.0 mm x 15 mm postdilated to 2.3 mm.  Normal LVEDP with mild lateral hypokinesis otherwise preserved EF. RECOMMENDATIONS  Has admit orders to the ICU, however could be transferred to 6E or 2C stepdown units if ICU beds become urgent  Check 2D echo tomorrow  Guideline Directed Medical Therapy: Start low-dose carvedilol, high-dose high intensity statin along with  DAPT.  Expect that she can probably be Fast-Track discharge on 11/22. Bryan Lemma, MD  DG Chest Port 1 View  Result Date: 09/29/2020 CLINICAL DATA:  Chest pain. EXAM: PORTABLE CHEST 1 VIEW COMPARISON:  August 25, 2014 FINDINGS: The heart size and mediastinal contours are within normal limits. Both lungs are clear. The visualized skeletal structures are unremarkable. IMPRESSION: No active disease. Electronically Signed   By: Gerome Sam III M.D   On: 09/29/2020 14:53    Cardiac Studies   Echo pending  09/29/2020 LHC  Severe single-vessel disease with 100% occluded ostial and proximal Ramus Intermedius with otherwise mild to moderate disease elsewhere (mostly mild but moderate in the distal LAD) ? Successful DES PCI of Ramus Intermedius using resolute Onyx DES 2.0 mm x 15 mm postdilated to 2.3 mm.  Normal LVEDP with mild lateral hypokinesis otherwise preserved EF.    Patient Profile     44 y.o. female who presented with STEMI and found to have a RI acute occlusion successfully treated with a DES.   Assessment & Plan    1. Acute lateral STEMI Post PCI to RI. Continue asa, ticagrelor, atorva Increase coreg dose today  2. Nausea ECG without ischemic findings today. May be related to sedation given her history of intolerance to sedation.  For questions or updates, please contact [facility] Please consult [email or web address] for contact info under Cardiology/STEMI.      Signed, Steffanie Dunn, MD  09/30/2020, 10:12 AM

## 2020-09-30 NOTE — Progress Notes (Signed)
Echocardiogram 2D Echocardiogram has been performed.  Renee Fitzgerald 09/30/2020, 11:19 AM

## 2020-10-01 ENCOUNTER — Encounter (HOSPITAL_COMMUNITY): Payer: Self-pay | Admitting: Cardiology

## 2020-10-01 DIAGNOSIS — I2510 Atherosclerotic heart disease of native coronary artery without angina pectoris: Secondary | ICD-10-CM | POA: Diagnosis not present

## 2020-10-01 DIAGNOSIS — E785 Hyperlipidemia, unspecified: Secondary | ICD-10-CM

## 2020-10-01 DIAGNOSIS — Z9861 Coronary angioplasty status: Secondary | ICD-10-CM

## 2020-10-01 DIAGNOSIS — I2129 ST elevation (STEMI) myocardial infarction involving other sites: Secondary | ICD-10-CM | POA: Diagnosis not present

## 2020-10-01 DIAGNOSIS — I10 Essential (primary) hypertension: Secondary | ICD-10-CM | POA: Diagnosis not present

## 2020-10-01 DIAGNOSIS — R030 Elevated blood-pressure reading, without diagnosis of hypertension: Secondary | ICD-10-CM | POA: Clinically undetermined

## 2020-10-01 HISTORY — DX: Hyperlipidemia, unspecified: E78.5

## 2020-10-01 HISTORY — DX: Essential (primary) hypertension: I10

## 2020-10-01 LAB — POCT I-STAT, CHEM 8
BUN: 9 mg/dL (ref 6–20)
Calcium, Ion: 1.19 mmol/L (ref 1.15–1.40)
Chloride: 107 mmol/L (ref 98–111)
Creatinine, Ser: 0.6 mg/dL (ref 0.44–1.00)
Glucose, Bld: 117 mg/dL — ABNORMAL HIGH (ref 70–99)
HCT: 36 % (ref 36.0–46.0)
Hemoglobin: 12.2 g/dL (ref 12.0–15.0)
Potassium: 3.8 mmol/L (ref 3.5–5.1)
Sodium: 139 mmol/L (ref 135–145)
TCO2: 21 mmol/L — ABNORMAL LOW (ref 22–32)

## 2020-10-01 LAB — POCT ACTIVATED CLOTTING TIME
Activated Clotting Time: 257 seconds
Activated Clotting Time: 285 seconds

## 2020-10-01 MED ORDER — NITROGLYCERIN 0.4 MG SL SUBL: 0.4000 mg | SUBLINGUAL_TABLET | SUBLINGUAL | 2 refills | Status: DC | PRN

## 2020-10-01 MED ORDER — TICAGRELOR 90 MG PO TABS
90.0000 mg | ORAL_TABLET | Freq: Two times a day (BID) | ORAL | 2 refills | Status: DC
Start: 2020-10-01 — End: 2021-05-29

## 2020-10-01 MED ORDER — ASPIRIN 81 MG PO CHEW
81.0000 mg | CHEWABLE_TABLET | Freq: Every day | ORAL | 1 refills | Status: DC
Start: 2020-10-02 — End: 2021-10-01

## 2020-10-01 MED ORDER — CARVEDILOL 6.25 MG PO TABS
6.2500 mg | ORAL_TABLET | Freq: Two times a day (BID) | ORAL | 0 refills | Status: DC
Start: 2020-10-01 — End: 2020-11-12

## 2020-10-01 MED ORDER — ATORVASTATIN CALCIUM 80 MG PO TABS
80.0000 mg | ORAL_TABLET | Freq: Every day | ORAL | 0 refills | Status: DC
Start: 2020-10-02 — End: 2020-11-13

## 2020-10-01 MED FILL — Nitroglycerin IV Soln 100 MCG/ML in D5W: INTRA_ARTERIAL | Qty: 10 | Status: AC

## 2020-10-01 NOTE — Progress Notes (Signed)
CARDIOLOGY OFFICE NOTE  Date:  10/10/2020    [name] Date of Birth: 03-Oct-1976 Medical Record #[id number]  PCP:  Ursula Beath, MD  Cardiologist:  Renee Fitzgerald  Chief Complaint  Patient presents with  . Hospitalization Follow-up    Seen for Dr. Herbie Fitzgerald    History of Present Illness: Renee Fitzgerald is a 44 y.o. female who presents today for a post hospital visit. Seen for Dr. Herbie Fitzgerald (NEW).   She has no prior medical history other than some anxiety and migraines. She has a +FH for early CAD.   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 Treated with PCI/DES of ost-prox RI. She is on DAPT for one year. Echo with normal EF and apical akinesis.   She presented back over the holiday weekend with chest pain - was started on Imdur. She has not tolerated this due to headache and it was stopped.   Comes in today. Here with her father today - he is 34. Feels weak and tired. No energy. Really bad headaches - she has continued on 1/2 dose of Imdur. She is quite scared and fearful - scared to eat and go to sleep. No more chest pain. Some shortness of breath - on Brilinta. No bleeding. No problems with cath site. Has returned to work - works from home remotely. Asking about alternative medicine for migraines - was on Maxalt. Remains on BCP as well. Asking about supplements. Weight restrictions for lifting, etc. She wants to get back to walking her horses.   Past Medical History:  Diagnosis Date  . Anemia   . CAD S/P PCI of 100% ost-prox RI 09/29/2020   Lateral STEMI -> 100% ost-prox Diag - Resolute Onyx DES 2.0 x 15 mm - 2.3 mm  . Essential hypertension 10/01/2020  . Hyperlipidemia with target LDL less than 70 10/01/2020  . Migraine   . ST elevation myocardial infarction (STEMI) of lateral wall (HCC) 09/29/2020   RI 100% - DES PCI.     Past Surgical History:  Procedure Laterality Date  . CORONARY STENT INTERVENTION N/A 09/29/2020   Procedure: CORONARY  STENT INTERVENTION;  Surgeon: Marykay Lex, MD;  Location: [facility] INVASIVE CV LAB;  Service: Cardiovascular;  Laterality: N/A;  . CORONARY/GRAFT ACUTE MI REVASCULARIZATION N/A 09/29/2020   Procedure: Coronary/Graft Acute MI Revascularization;  Surgeon: Marykay Lex, MD;  Location: [facility] INVASIVE CV LAB;  Service: Cardiovascular;  Laterality: N/A;  . LEFT HEART CATH AND CORONARY ANGIOGRAPHY N/A 09/29/2020   Procedure: LEFT HEART CATH AND CORONARY ANGIOGRAPHY;  Surgeon: Marykay Lex, MD;  Location: [facility] INVASIVE CV LAB;  Service: Cardiovascular;  Laterality: N/A;  . WISDOM TOOTH EXTRACTION       Medications: Current Meds  Medication Sig  . ALPRAZolam (XANAX) 0.25 MG tablet Take 0.25 mg by mouth daily as needed for anxiety.   Marland Kitchen aspirin 81 MG chewable tablet Chew 1 tablet (81 mg total) by mouth daily.  Marland Kitchen atorvastatin (LIPITOR) 80 MG tablet Take 1 tablet (80 mg total) by mouth daily.  . carvedilol (COREG) 6.25 MG tablet Take 1 tablet (6.25 mg total) by mouth 2 (two) times daily with a meal.  . levonorgestrel-ethinyl estradiol (ALTAVERA) 0.15-30 MG-MCG tablet Take 1 tablet by mouth at bedtime.  . Melatonin 10 MG TABS Take 10 mg by mouth at bedtime.  . Multiple Vitamin (MULTIVITAMIN WITH MINERALS) TABS tablet Take 1 tablet by mouth once a week.  . nitroGLYCERIN (NITROSTAT) 0.4 MG SL  tablet Place 1 tablet (0.4 mg total) under the tongue every 5 (five) minutes as needed.  Marland Kitchen omeprazole (PRILOSEC) 20 MG capsule Take 20 mg by mouth daily as needed (acid reflux).   . ondansetron (ZOFRAN) 4 MG tablet Take 4 mg by mouth every 8 (eight) hours as needed for nausea or vomiting.   . promethazine (PHENERGAN) 25 MG tablet Take 25 mg by mouth daily as needed for nausea or vomiting.   . ticagrelor (BRILINTA) 90 MG TABS tablet Take 1 tablet (90 mg total) by mouth 2 (two) times daily.  Marland Kitchen zolpidem (AMBIEN) 10 MG tablet Take 10 mg by mouth at bedtime as needed for sleep.  . [DISCONTINUED] isosorbide mononitrate  (IMDUR) 30 MG 24 hr tablet Take 1 tablet (30 mg total) by mouth daily.  . [DISCONTINUED] rizatriptan (MAXALT-MLT) 10 MG disintegrating tablet Take by mouth.     Allergies: Allergies  Allergen Reactions  . Penicillins Rash    Social History: The patient  reports that she has never smoked. She has never used smokeless tobacco. She reports that she does not drink alcohol and does not use drugs.   Family History: The patient's family history includes CAD (age of onset: 66) in her paternal grandfather; Healthy in her father and mother; Heart attack (age of onset: 63) in her paternal grandfather.  Father is 62 and has some CAD.   Review of Systems: Please see the history of present illness.   All other systems are reviewed and negative.   Physical Exam: VS:  BP 120/76   Pulse 78   Ht 5\' 2"  (1.575 m)   Wt 182 lb 12.8 oz (82.9 kg)   LMP 09/30/2020   SpO2 98%   BMI 33.43 kg/m  .  BMI Body mass index is 33.43 kg/m.  Wt Readings from Last 3 Encounters:  10/10/20 182 lb 12.8 oz (82.9 kg)  10/05/20 185 lb 3 oz (84 kg)  10/01/20 183 lb 13.8 oz (83.4 kg)    General: Alert and in no acute distress. She is obese.   Cardiac: Regular rate and rhythm. No murmurs, rubs, or gallops. No edema.  Respiratory:  Lungs are clear to auscultation bilaterally with normal work of breathing.  GI: Soft and nontender.  MS: No deformity or atrophy. Gait and ROM intact.  Skin: Warm and dry. Color is normal.  Neuro:  Strength and sensation are intact and no gross focal deficits noted.  Psych: Alert, appropriate and with normal affect. Right wrist looks fine.    LABORATORY DATA:  EKG:  EKG is not ordered today.    Lab Results  Component Value Date   WBC 6.0 10/05/2020   HGB 12.0 10/05/2020   HCT 36.4 10/05/2020   PLT 292 10/05/2020   GLUCOSE 106 (H) 10/05/2020   CHOL 262 (H) 09/30/2020   TRIG 90 09/30/2020   HDL 85 09/30/2020   LDLCALC 159 (H) 09/30/2020   ALT 19 09/29/2020   AST 19  09/29/2020   NA 135 10/05/2020   K 3.9 10/05/2020   CL 103 10/05/2020   CREATININE 0.83 10/05/2020   BUN 7 10/05/2020   CO2 24 10/05/2020   INR 1.2 09/29/2020   HGBA1C 5.4 09/30/2020     BNP (last 3 results) No results for input(s): BNP in the last 8760 hours.  ProBNP (last 3 results) No results for input(s): PROBNP in the last 8760 hours.   Other Studies Reviewed Today:  Cath: 09/29/20   The left ventricular systolic function is  normal. The left ventricular ejection fraction is 55-65% by visual estimate -suggestion of anterolateral hypokinesis  LV end diastolic pressure is normal. No MR or AI  ----------------------------------------  CULPRIT LESION: Ramus lesion is 100% stenosed.  A drug-eluting stent was successfully placed using a STENT RESOLUTE ONYX 2.0X15. Postdilated to 2.3 mm  Post intervention, there is a 0% residual stenosis.  Dist LAD-1 lesion is 30% stenosed. Dist LAD-2 lesion is 45% stenosed. With diffuse mild-mild to moderate disease throughout the RCA and LCx stent.  SUMMARY  Severe single-vessel disease with 100% occluded ostial and proximal Ramus Intermedius with otherwise mild to moderate disease elsewhere (mostly mild but moderate in the distal LAD) ? Successful DES PCI of Ramus Intermedius using resolute Onyx DES 2.0 mm x 15 mm postdilated to 2.3 mm.  Normal LVEDP with mild lateral hypokinesis otherwise preserved EF.   RECOMMENDATIONS  Has admit orders to the ICU, however could be transferred to 6E or 2C stepdown units if ICU beds become urgent  Check 2D echo tomorrow  Guideline Directed Medical Therapy: Start low-dose carvedilol, high-dose high intensity statin along with DAPT.  Expect that she can probably be Fast-Track discharge on 11/22.   Bryan Lemma, MD  Diagnostic Dominance: Right  Intervention   Echo: 09/30/20  IMPRESSIONS    1. Left ventricular ejection fraction, by estimation, is 60 to 65%. The  left  ventricle has normal function. The left ventricle demonstrates  regional wall motion abnormalities (see scoring diagram/findings for  description). Left ventricular diastolic  parameters were normal.  2. Right ventricular systolic function is normal. The right ventricular  size is normal.  3. The mitral valve is normal in structure. No evidence of mitral valve  regurgitation. No evidence of mitral stenosis.  4. The aortic valve is normal in structure. Aortic valve regurgitation is  not visualized. No aortic stenosis is present.  5. The inferior vena cava is normal in size with greater than 50%  respiratory variability, suggesting right atrial pressure of 3 mmHg.   FINDINGS  Left Ventricle: Left ventricular ejection fraction, by estimation, is 60  to 65%. The left ventricle has normal function. The left ventricle  demonstrates regional wall motion abnormalities. The left ventricular  internal cavity size was normal in size.  There is no left ventricular hypertrophy. Left ventricular diastolic  parameters were normal.  ____________   Assessment/Plan:  1. STEMI - s/p cath with PCI/DES of ost/prox RI - she is on DAPT - she has residual disease to manage medically. Had normal EF. Would like to go to cardiac rehab. Ok to get back to walking her horses. Ok to gradually resume her activities. Baseline lab today. Her fear is pretty noticeable - we discussed this at length and how to not let this "paralyze" her into living her life going forward.  Would have her talk with GYN about her hormone therapy/BCP. Continue DAPT.  We will stop the Imdur due to headaches and tendency towards migraines. Would consider low dose Norvasc if needed.   2. HLD - on high dose statin - fasting labs on return. Would suggest getting her vitamin D level checked and replaced if needed.   3. Migraines - would not use Maxalt - she is going to talk to her PCP about other alternatives. She is now on beta blocker which  may help provide some relief.   4. FH + for early CAD - needs aggressive CV risk factor modification.   5. Obesity - she does seem motivated to  make changes.   6. HTN - BP is fine on her current regimen.    Current medicines are reviewed with the patient today.  The patient does not have concerns regarding medicines other than what has been noted above.  The following changes have been made:  See above.  Labs/ tests ordered today include:    Orders Placed This Encounter  Procedures  . Basic metabolic panel  . CBC     Disposition:   FU with Dr. Herbie BaltimoreHarding in 4 weeks with fasting labs.     Patient is agreeable to this plan and will call if any problems develop in the interim.   SignedNorma Fredrickson: Kayloni Rocco, NP  10/10/2020 9:51 AM  [facility] [address] [place], [place]  [address] Phone: [phone] Fax: [phone]

## 2020-10-01 NOTE — TOC Benefit Eligibility Note (Signed)
Transition of Care [facility]) Benefit Eligibility Note    Patient Details  Name: Renee Fitzgerald MRN: [id number] Date of Birth: [birth year]   Medication/Dose: BRILINTA  90 MG BID  Covered?: Yes  Tier: 3 Drug  Prescription Coverage Preferred Pharmacy: [facility]  Spoke with Person/Company/Phone Number:: CHRIS  @  [employer] #   [phone]  Co-Pay: $ 40.00  Prior Approval: No  Deductible:  (NO DEDUCTIBLE WITH PLAN)  Additional Notes: TICAGRELOR: [name] Phone Number: 10/01/2020, 11:43 AM

## 2020-10-01 NOTE — TOC Transition Note (Addendum)
Transition of Care [facility]) - CM/SW Discharge Note   Patient Details  Name: Renee Fitzgerald MRN: [id number] Date of Birth: Apr 26, 1976  [facility]) CM/SW Contact:  Epifanio Lesches, RN Phone Number: [phone] 10/01/2020, 11:08 AM   Clinical Narrative:    Patient will DC to: home  Anticipated DC date: 10/01/2020 Family notified: husband Transport by: car  TOI:[id number], JENNIFER   Presented with CP/STEMI.    - s/p cath., 11/20 .... DES PCI of Ramus Intermedius   Per MD patient ready for DC today. From home with husband. PTA independent with ADL's. RN, patient, patient's husband aware of DC. NCM spoke with pt and husband in regard to d/c plan. Pt to d/c to home on  Brilinta, prescription noted. NCM provided pt with a Brilinta  co-pay card to assist with cost with explanation.  Benefit check for Brilinta in process ......  Pt without DME needs.  Pt with post hospital f/u noted on AVS.  RNCM will sign off for now as intervention is no longer needed. Please consult [place] again if new needs arise.    Final next level of care: Home/Self Care Barriers to Discharge: No Barriers Identified   Patient Goals and CMS Choice        Discharge Placement                       Discharge Plan and Services                                     Social Determinants of Health (SDOH) Interventions     Readmission Risk Interventions No flowsheet data found.

## 2020-10-01 NOTE — Discharge Summary (Signed)
Discharge Summary    Patient ID: Renee Fitzgerald MRN: [id number]; DOB: [birth year]  Admit date: 09/29/2020 Discharge date: 10/01/2020  Primary Care Provider: Ursula Beath, MD  Primary Cardiologist: Bryan Lemma, MD  Primary Electrophysiologist:  None   Discharge Diagnoses    Principal Problem:   Acute ST elevation myocardial infarction (STEMI) of lateral wall [facility]) Active Problems:   CAD S/P PCI of 100% ost-prox RI   Hyperlipidemia with target LDL less than 70   Essential hypertension   Diagnostic Studies/Procedures    Cath: 09/29/20   The left ventricular systolic function is normal. The left ventricular ejection fraction is 55-65% by visual estimate -suggestion of anterolateral hypokinesis  LV end diastolic pressure is normal. No MR or AI  ----------------------------------------  CULPRIT LESION: Ramus lesion is 100% stenosed.  A drug-eluting stent was successfully placed using a STENT RESOLUTE ONYX 2.0X15. Postdilated to 2.3 mm  Post intervention, there is a 0% residual stenosis.  Dist LAD-1 lesion is 30% stenosed. Dist LAD-2 lesion is 45% stenosed. With diffuse mild-mild to moderate disease throughout the RCA and LCx stent.   SUMMARY  Severe single-vessel disease with 100% occluded ostial and proximal Ramus Intermedius with otherwise mild to moderate disease elsewhere (mostly mild but moderate in the distal LAD) ? Successful DES PCI of Ramus Intermedius using resolute Onyx DES 2.0 mm x 15 mm postdilated to 2.3 mm.  Normal LVEDP with mild lateral hypokinesis otherwise preserved EF.   RECOMMENDATIONS  Has admit orders to the ICU, however could be transferred to 6E or 2C stepdown units if ICU beds become urgent  Check 2D echo tomorrow  Guideline Directed Medical Therapy: Start low-dose carvedilol, high-dose high intensity statin along with DAPT.  Expect that she can probably be Fast-Track discharge on 11/22.   Bryan Lemma,  MD  Diagnostic Dominance: Right  Intervention   Echo: 09/30/20  IMPRESSIONS    1. Left ventricular ejection fraction, by estimation, is 60 to 65%. The  left ventricle has normal function. The left ventricle demonstrates  regional wall motion abnormalities (see scoring diagram/findings for  description). Left ventricular diastolic  parameters were normal.  2. Right ventricular systolic function is normal. The right ventricular  size is normal.  3. The mitral valve is normal in structure. No evidence of mitral valve  regurgitation. No evidence of mitral stenosis.  4. The aortic valve is normal in structure. Aortic valve regurgitation is  not visualized. No aortic stenosis is present.  5. The inferior vena cava is normal in size with greater than 50%  respiratory variability, suggesting right atrial pressure of 3 mmHg.   FINDINGS  Left Ventricle: Left ventricular ejection fraction, by estimation, is 60  to 65%. The left ventricle has normal function. The left ventricle  demonstrates regional wall motion abnormalities. The left ventricular  internal cavity size was normal in size.  There is no left ventricular hypertrophy. Left ventricular diastolic  parameters were normal.  ____________   History of Present Illness     Renee Fitzgerald is a 44 y.o. female with no PMH who presented with chest pain and CODE STEMI. She reported doing well and [place].  She was raising her arms up to place a heavy bucket of hot water on a hook, she started feeling chest discomfort states with nausea.  She thought was just that she was hot so she walked outside continued having chest discomfort so she went to lie down, she  started having nausea with dry heaves but no emesis.  Pain continued and therefore she contacted EMS.  Upon arrival EMS EKG was concerning second EKG showed more concern for concave ST elevations in lateral leads.  Code STEMI was  called. Significant 8-9/10 chest pain upon arrival to ER.  Follow-up EKG showed worsening convex affiliations. She was taken to the cath lab emergently.      Hospital Course     1. Acute ST elevation myocardial infarction: presented with acute onset of chest pain. Underwent cardiac cath noted above with PCI/DES of 100% ost-prox RI. hsTn peaked at >27000. Placed on DAPT with ASA/Brilinta for at least one year. No further angina post cath. Worked well with cardiac rehab. Echo with normal EF and apical akinesis. Tolerated the addition of coreg which was titrated to 6.25mg  BID.   2. Hyperlipidemia: with target LDL less than 70: Current LDL 156, suspect she may need more than atorvastatin 80 mg. -- FLP/LFTs in 8 weeks -- Low threshold for PCSK9 inhibitor.    3. Essential hypertension: Blood pressure currently stable on 6.25 mg mg twice daily carvedilol.  Did the patient have an acute coronary syndrome (MI, NSTEMI, STEMI, etc) this admission?:  Yes                               AHA/ACC Clinical Performance & Quality Measures: 3. Aspirin prescribed? - Yes 4. ADP Receptor Inhibitor (Plavix/Clopidogrel, Brilinta/Ticagrelor or Effient/Prasugrel) prescribed (includes medically managed patients)? - Yes 5. Beta Blocker prescribed? - Yes 6. High Intensity Statin (Lipitor 40-80mg  or Crestor 20-40mg ) prescribed? - Yes 7. EF assessed during THIS hospitalization? - Yes 8. For EF <40%, was ACEI/ARB prescribed? - Not Applicable (EF >/= 40%) 9. For EF <40%, Aldosterone Antagonist (Spironolactone or Eplerenone) prescribed? - Not Applicable (EF >/= 40%) 10. Cardiac Rehab Phase II ordered (including medically managed patients)? - Yes       _____________  Discharge Vitals Blood pressure 138/87, pulse 94, temperature 98.2 F (36.8 C), temperature source Oral, resp. rate 11, height 5\' 2"  (1.575 m), weight 83.4 kg, SpO2 97 %.  Filed Weights   09/29/20 1157 10/01/20 0655  Weight: 77.1 kg 83.4 kg    Labs &  Radiologic Studies    CBC Recent Labs    09/29/20 1145 09/30/20 0103  WBC 6.4 10.0  NEUTROABS 4.1  --   HGB 11.8* 12.8  HCT 37.6 39.6  MCV 94.5 91.7  PLT 260 283   Basic Metabolic Panel Recent Labs    45/40/9811/20/21 1145 09/30/20 0103 09/30/20 0439  NA 137 136  --   K 3.7 3.9  --   CL 105 103  --   CO2 21* 19*  --   GLUCOSE 122* 111*  --   BUN 9 6  --   CREATININE 0.84 0.81  --   CALCIUM 8.7* 9.0  --   MG  --   --  2.0   Liver Function Tests Recent Labs    09/29/20 1145  AST 19  ALT 19  ALKPHOS 47  BILITOT 0.4  PROT 6.8  ALBUMIN 3.4*   No results for input(s): LIPASE, AMYLASE in the last 72 hours. High Sensitivity Troponin:   Recent Labs  Lab 09/29/20 1145 09/29/20 1446 09/29/20 1831 09/30/20 0103 09/30/20 0439  TROPONINIHS 36* 13,646* >27,000* >27,000* 23,293*    BNP Invalid input(s): POCBNP D-Dimer No results for input(s): DDIMER in the last 72 hours. Hemoglobin  A1C Recent Labs    09/30/20 0103  HGBA1C 5.4   Fasting Lipid Panel Recent Labs    09/30/20 0103  CHOL 262*  HDL 85  LDLCALC 159*  TRIG 90  CHOLHDL 3.1   Thyroid Function Tests No results for input(s): TSH, T4TOTAL, T3FREE, THYROIDAB in the last 72 hours.  Invalid input(s): FREET3 _____________  CARDIAC CATHETERIZATION  Result Date: 09/29/2020  The left ventricular systolic function is normal. The left ventricular ejection fraction is 55-65% by visual estimate -suggestion of anterolateral hypokinesis  LV end diastolic pressure is normal. No MR or AI  ----------------------------------------  CULPRIT LESION: Ramus lesion is 100% stenosed.  A drug-eluting stent was successfully placed using a STENT RESOLUTE ONYX 2.0X15. Postdilated to 2.3 mm  Post intervention, there is a 0% residual stenosis.  Dist LAD-1 lesion is 30% stenosed. Dist LAD-2 lesion is 45% stenosed. With diffuse mild-mild to moderate disease throughout the RCA and LCx stent.  SUMMARY  Severe single-vessel disease  with 100% occluded ostial and proximal Ramus Intermedius with otherwise mild to moderate disease elsewhere (mostly mild but moderate in the distal LAD)  Successful DES PCI of Ramus Intermedius using resolute Onyx DES 2.0 mm x 15 mm postdilated to 2.3 mm.  Normal LVEDP with mild lateral hypokinesis otherwise preserved EF. RECOMMENDATIONS  Has admit orders to the ICU, however could be transferred to 6E or 2C stepdown units if ICU beds become urgent  Check 2D echo tomorrow  Guideline Directed Medical Therapy: Start low-dose carvedilol, high-dose high intensity statin along with DAPT.  Expect that she can probably be Fast-Track discharge on 11/22. Bryan Lemma, MD  DG Chest Port 1 View  Result Date: 09/29/2020 CLINICAL DATA:  Chest pain. EXAM: PORTABLE CHEST 1 VIEW COMPARISON:  August 25, 2014 FINDINGS: The heart size and mediastinal contours are within normal limits. Both lungs are clear. The visualized skeletal structures are unremarkable. IMPRESSION: No active disease. Electronically Signed   By: Gerome Sam III M.D   On: 09/29/2020 14:53   ECHOCARDIOGRAM COMPLETE  Result Date: 09/30/2020    ECHOCARDIOGRAM REPORT   Patient Name:   Renee Fitzgerald Date of Exam: 09/30/2020 Medical Rec #:  [id number]        Height:       62.0 in Accession #:    [id number]       Weight:       170.0 lb Date of Birth:  04-Dec-1975        BSA:          1.784 m Patient Age:    44 years         BP:           129/83 mmHg Patient Gender: F                HR:           77 bpm. Exam Location:  Inpatient Procedure: 2D Echo, Color Doppler and Cardiac Doppler Indications:    CAD Native Vessel i25.0, STEMI  History:        Patient has no prior history of Echocardiogram examinations.                 CAD.  Sonographer:    Irving Burton Senior RDCS Referring Phys: [phone] DAVID W HARDING IMPRESSIONS  1. Left ventricular ejection fraction, by estimation, is 60 to 65%. The left ventricle has normal function. The left ventricle demonstrates  regional wall motion abnormalities (see scoring diagram/findings for description). Left ventricular diastolic  parameters were normal.  2. Right ventricular systolic function is normal. The right ventricular size is normal.  3. The mitral valve is normal in structure. No evidence of mitral valve regurgitation. No evidence of mitral stenosis.  4. The aortic valve is normal in structure. Aortic valve regurgitation is not visualized. No aortic stenosis is present.  5. The inferior vena cava is normal in size with greater than 50% respiratory variability, suggesting right atrial pressure of 3 mmHg. FINDINGS  Left Ventricle: Left ventricular ejection fraction, by estimation, is 60 to 65%. The left ventricle has normal function. The left ventricle demonstrates regional wall motion abnormalities. The left ventricular internal cavity size was normal in size. There is no left ventricular hypertrophy. Left ventricular diastolic parameters were normal.  LV Wall Scoring: The apical anterior segment is akinetic. Right Ventricle: The right ventricular size is normal. No increase in right ventricular wall thickness. Right ventricular systolic function is normal. Left Atrium: Left atrial size was normal in size. Right Atrium: Right atrial size was normal in size. Pericardium: There is no evidence of pericardial effusion. Mitral Valve: The mitral valve is normal in structure. No evidence of mitral valve regurgitation. No evidence of mitral valve stenosis. Tricuspid Valve: The tricuspid valve is normal in structure. Tricuspid valve regurgitation is not demonstrated. No evidence of tricuspid stenosis. Aortic Valve: The aortic valve is normal in structure. Aortic valve regurgitation is not visualized. No aortic stenosis is present. Pulmonic Valve: The pulmonic valve was normal in structure. Pulmonic valve regurgitation is not visualized. No evidence of pulmonic stenosis. Aorta: The aortic root is normal in size and structure. Venous:  The inferior vena cava is normal in size with greater than 50% respiratory variability, suggesting right atrial pressure of 3 mmHg. IAS/Shunts: No atrial level shunt detected by color flow Doppler.  LEFT VENTRICLE PLAX 2D LVIDd:         4.40 cm  Diastology LVIDs:         2.70 cm  LV e' medial:    7.83 cm/s LV PW:         1.10 cm  LV E/e' medial:  9.2 LV IVS:        1.10 cm  LV e' lateral:   11.70 cm/s LVOT diam:     1.80 cm  LV E/e' lateral: 6.2 LV SV:         67 LV SV Index:   37 LVOT Area:     2.54 cm  RIGHT VENTRICLE RV S prime:     9.79 cm/s TAPSE (M-mode): 1.8 cm LEFT ATRIUM             Index       RIGHT ATRIUM           Index LA diam:        3.70 cm 2.07 cm/m  RA Area:     14.50 cm LA Vol (A2C):   55.0 ml 30.83 ml/m RA Volume:   35.40 ml  19.84 ml/m LA Vol (A4C):   33.6 ml 18.83 ml/m LA Biplane Vol: 43.4 ml 24.33 ml/m  AORTIC VALVE LVOT Vmax:   116.00 cm/s LVOT Vmean:  83.600 cm/s LVOT VTI:    0.262 m  AORTA Ao Root diam: 3.20 cm Ao Asc diam:  3.30 cm MITRAL VALVE MV Area (PHT): 3.42 cm    SHUNTS MV Decel Time: 222 msec    Systemic VTI:  0.26 m MV E velocity: 72.30 cm/s  Systemic Diam: 1.80 cm MV A  velocity: 64.50 cm/s MV E/A ratio:  1.12 Donato Schultz MD Electronically signed by Donato Schultz MD Signature Date/Time: 09/30/2020/11:29:18 AM    Final    Disposition   Pt is being discharged home today in good condition.  Follow-up Plans & Appointments     Follow-up Information    Rosalio Macadamia, NP Follow up on 10/10/2020.   Specialties: Nurse Practitioner, Interventional Cardiology, Cardiology, Radiology Why: at 9:15am for your follow up appt.  Contact information: [address] [place] [address] [phone]        Ursula Beath, MD .   Specialty: Family Medicine Contact information: [address] [place] [address] [phone]        Marykay Lex, MD .   Specialty: Cardiology Contact information: [address] [place]  [place] [address] [phone]              Discharge Instructions    Amb Referral to Cardiac Rehabilitation   Complete by: As directed    Will refer to [place] [facility] 2   Diagnosis:  STEMI Coronary Stents     After initial evaluation and assessments completed: Virtual Based Care may be provided alone or in conjunction with Phase 2 Cardiac Rehab based on patient barriers.: Yes   Call MD for:  redness, tenderness, or signs of infection (pain, swelling, redness, odor or green/yellow discharge around incision site)   Complete by: As directed    Diet - low sodium heart healthy   Complete by: As directed    Discharge instructions   Complete by: As directed    Radial Site Care Refer to this sheet in the next few weeks. These instructions provide you with information on caring for yourself after your procedure. Your caregiver may also give you more specific instructions. Your treatment has been planned according to current medical practices, but problems sometimes occur. Call your caregiver if you have any problems or questions after your procedure. HOME CARE INSTRUCTIONS You may shower the day after the procedure.Remove the bandage (dressing) and gently wash the site with plain soap and water.Gently pat the site dry.  Do not apply powder or lotion to the site.  Do not submerge the affected site in water for 3 to 5 days.  Inspect the site at least twice daily.  Do not flex or bend the affected arm for 24 hours.  No lifting over 5 pounds (2.3 kg) for 5 days after your procedure.  Do not drive home if you are discharged the same day of the procedure. Have someone else drive you.  You may drive 24 hours after the procedure unless otherwise instructed by your caregiver.  What to expect: Any bruising will usually fade within 1 to 2 weeks.  Blood that collects in the tissue (hematoma) may be painful to the touch. It should usually decrease in size and tenderness within 1 to 2 weeks.  SEEK IMMEDIATE  MEDICAL CARE IF: You have unusual pain at the radial site.  You have redness, warmth, swelling, or pain at the radial site.  You have drainage (other than a small amount of blood on the dressing).  You have chills.  You have a fever or persistent symptoms for more than 72 hours.  You have a fever and your symptoms suddenly get worse.  Your arm becomes pale, cool, tingly, or numb.  You have heavy bleeding from the site. Hold pressure on the site.   PLEASE DO NOT MISS ANY DOSES  OF YOUR BRILINTA!!!!! Also keep a log of you blood pressures and bring back to your follow up appt. Please call the office with any questions.   Patients taking blood thinners should generally stay away from medicines like ibuprofen, Advil, Motrin, naproxen, and Aleve due to risk of stomach bleeding. You may take Tylenol as directed or talk to your primary doctor about alternatives.    PLEASE ENSURE THAT YOU DO NOT RUN OUT OF YOUR BRILINTA.This medication is very important to remain on for at least one year. IF you have issues obtaining this medication due to cost please CALL the office 3-5 business days prior to running out in order to prevent missing doses of this medication.   Increase activity slowly   Complete by: As directed       Discharge Medications   Allergies as of 10/01/2020      Reactions   Penicillins Rash      Medication List    STOP taking these medications   ADVIL PM PO   ondansetron 8 MG disintegrating tablet Commonly known as: Zofran ODT   rizatriptan 10 MG disintegrating tablet Commonly known as: MAXALT-MLT     TAKE these medications   ALPRAZolam 0.25 MG tablet Commonly known as: XANAX Take 0.25 mg by mouth daily as needed for anxiety.   Altavera 0.15-30 MG-MCG tablet Generic drug: levonorgestrel-ethinyl estradiol Take 1 tablet by mouth at bedtime.   aspirin 81 MG chewable tablet Chew 1 tablet (81 mg total) by mouth daily. Start taking on: October 02, 2020   atorvastatin  80 MG tablet Commonly known as: LIPITOR Take 1 tablet (80 mg total) by mouth daily. Start taking on: October 02, 2020   carvedilol 6.25 MG tablet Commonly known as: COREG Take 1 tablet (6.25 mg total) by mouth 2 (two) times daily with a meal.   Melatonin 10 MG Tabs Take 10 mg by mouth at bedtime.   multivitamin with minerals Tabs tablet Take 1 tablet by mouth once a week.   nitroGLYCERIN 0.4 MG SL tablet Commonly known as: Nitrostat Place 1 tablet (0.4 mg total) under the tongue every 5 (five) minutes as needed.   omeprazole 20 MG capsule Commonly known as: PRILOSEC Take 20 mg by mouth daily as needed (acid reflux).   ondansetron 4 MG tablet Commonly known as: ZOFRAN Take 4 mg by mouth every 8 (eight) hours as needed for nausea or vomiting.   promethazine 25 MG tablet Commonly known as: PHENERGAN Take 25 mg by mouth daily as needed for nausea or vomiting.   ticagrelor 90 MG Tabs tablet Commonly known as: BRILINTA Take 1 tablet (90 mg total) by mouth 2 (two) times daily.   zolpidem 10 MG tablet Commonly known as: AMBIEN Take 10 mg by mouth at bedtime as needed for sleep.       Outstanding Labs/Studies   FLP/LFTs in 8 weeks  Duration of Discharge Encounter   Greater than 30 minutes including physician time.  Signed, Laverda Page, NP 10/01/2020, 11:09 AM

## 2020-10-01 NOTE — Progress Notes (Signed)
Progress Note  Patient Name: Renee Fitzgerald Date of Encounter: 10/01/2020  [facility] Cardiologist: Bryan Lemma, MD   Subjective   Feeling well.  No further pain or nausea.  Inpatient Medications    Scheduled Meds: . aspirin  81 mg Oral Daily  . atorvastatin  80 mg Oral Daily  . carvedilol  6.25 mg Oral BID WC  . Chlorhexidine Gluconate Cloth  6 each Topical Daily  . sodium chloride flush  3 mL Intravenous Q12H  . ticagrelor  90 mg Oral BID   Continuous Infusions: . sodium chloride 10 mL/hr at 09/30/20 0800  . sodium chloride     PRN Meds: sodium chloride, acetaminophen, metoCLOPramide (REGLAN) injection, morphine injection, ondansetron (ZOFRAN) IV, sodium chloride flush   Vital Signs    Vitals:   10/01/20 0600 10/01/20 0655 10/01/20 0700 10/01/20 0745  BP: 117/74  (!) 128/93   Pulse: 71  100   Resp: 15  18   Temp:    98.2 F (36.8 C)  TempSrc:    Oral  SpO2: 95%  95%   Weight:  83.4 kg    Height:       No intake or output data in the 24 hours ending 10/01/20 0825 Last 3 Weights 10/01/2020 09/29/2020 02/12/2018  Weight (lbs) 183 lb 13.8 oz 170 lb 168 lb  Weight (kg) 83.4 kg 77.111 kg 76.204 kg      Telemetry    Sinus rhythm, does have some tachycardia in the 110s 120s with ambulation.- Personally Reviewed  ECG    No new EKG - Personally Reviewed  Physical Exam   GEN: No acute distress.  Resting in bed comfortably.  A little bit anxious because she wants to go home. Neck: No JVD Cardiac: RRR, no M/R/G Respiratory: Clear to auscultation bilaterally. Non-labored GI: Soft, nontender, non-distended  MS: No edema; No deformity.  Right radial cath site has mild bruising otherwise stable. Neuro:  Nonfocal  Psych: Normal mood and affect  Labs    High Sensitivity Troponin:   Recent Labs  Lab 09/29/20 1145 09/29/20 1446 09/29/20 1831 09/30/20 0103 09/30/20 0439  TROPONINIHS 36* 13,646* >27,000* >27,000* 23,293*     Lab Results  Component  Value Date   CHOL 262 (H) 09/30/2020   HDL 85 09/30/2020   LDLCALC 159 (H) 09/30/2020   TRIG 90 09/30/2020   CHOLHDL 3.1 09/30/2020   Lab Results  Component Value Date   HGBA1C 5.4 09/30/2020    Chemistry Recent Labs  Lab 09/29/20 1145 09/30/20 0103  NA 137 136  K 3.7 3.9  CL 105 103  CO2 21* 19*  GLUCOSE 122* 111*  BUN 9 6  CREATININE 0.84 0.81  CALCIUM 8.7* 9.0  PROT 6.8  --   ALBUMIN 3.4*  --   AST 19  --   ALT 19  --   ALKPHOS 47  --   BILITOT 0.4  --   GFRNONAA >60 >60  ANIONGAP 11 14     Hematology Recent Labs  Lab 09/29/20 1145 09/30/20 0103  WBC 6.4 10.0  RBC 3.98 4.32  HGB 11.8* 12.8  HCT 37.6 39.6  MCV 94.5 91.7  MCH 29.6 29.6  MCHC 31.4 32.3  RDW 12.8 13.0  PLT 260 283    BNPNo results for input(s): BNP, PROBNP in the last 168 hours.   DDimer No results for input(s): DDIMER in the last 168 hours.   Radiology    CARDIAC CATHETERIZATION  Result Date: 09/29/2020  The left ventricular systolic function is normal. The left ventricular ejection fraction is 55-65% by visual estimate -suggestion of anterolateral hypokinesis  LV end diastolic pressure is normal. No MR or AI  ----------------------------------------  CULPRIT LESION: Ramus lesion is 100% stenosed.  A drug-eluting stent was successfully placed using a STENT RESOLUTE ONYX 2.0X15. Postdilated to 2.3 mm  Post intervention, there is a 0% residual stenosis.  Dist LAD-1 lesion is 30% stenosed. Dist LAD-2 lesion is 45% stenosed. With diffuse mild-mild to moderate disease throughout the RCA and LCx stent.  SUMMARY  Severe single-vessel disease with 100% occluded ostial and proximal Ramus Intermedius with otherwise mild to moderate disease elsewhere (mostly mild but moderate in the distal LAD)  Successful DES PCI of Ramus Intermedius using resolute Onyx DES 2.0 mm x 15 mm postdilated to 2.3 mm.  Normal LVEDP with mild lateral hypokinesis otherwise preserved EF. RECOMMENDATIONS  Has admit  orders to the ICU, however could be transferred to 6E or 2C stepdown units if ICU beds become urgent  Check 2D echo tomorrow  Guideline Directed Medical Therapy: Start low-dose carvedilol, high-dose high intensity statin along with DAPT.  Expect that she can probably be Fast-Track discharge on 11/22. Bryan Lemma, MD  DG Chest Port 1 View  Result Date: 09/29/2020 CLINICAL DATA:  Chest pain. EXAM: PORTABLE CHEST 1 VIEW COMPARISON:  August 25, 2014 FINDINGS: The heart size and mediastinal contours are within normal limits. Both lungs are clear. The visualized skeletal structures are unremarkable. IMPRESSION: No active disease. Electronically Signed   By: Gerome Sam III M.D   On: 09/29/2020 14:53   ECHOCARDIOGRAM COMPLETE  Result Date: 09/30/2020    ECHOCARDIOGRAM REPORT   Patient Name:   Renee Fitzgerald Date of Exam: 09/30/2020 Medical Rec #:  [id number]        Height:       62.0 in Accession #:    [id number]       Weight:       170.0 lb Date of Birth:  09-28-1976        BSA:          1.784 m Patient Age:    44 years         BP:           129/83 mmHg Patient Gender: F                HR:           77 bpm. Exam Location:  Inpatient Procedure: 2D Echo, Color Doppler and Cardiac Doppler Indications:    CAD Native Vessel i25.0, STEMI  History:        Patient has no prior history of Echocardiogram examinations.                 CAD.  Sonographer:    Renee Fitzgerald Senior RDCS Referring Phys: [phone] Renee Fitzgerald Renee Fitzgerald IMPRESSIONS  1. Left ventricular ejection fraction, by estimation, is 60 to 65%. The left ventricle has normal function. The left ventricle demonstrates regional wall motion abnormalities (see scoring diagram/findings for description). Left ventricular diastolic parameters were normal.  2. Right ventricular systolic function is normal. The right ventricular size is normal.  3. The mitral valve is normal in structure. No evidence of mitral valve regurgitation. No evidence of mitral stenosis.  4. The aortic  valve is normal in structure. Aortic valve regurgitation is not visualized. No aortic stenosis is present.  5. The inferior vena cava is normal in size with greater  than 50% respiratory variability, suggesting right atrial pressure of 3 mmHg. FINDINGS  Left Ventricle: Left ventricular ejection fraction, by estimation, is 60 to 65%. The left ventricle has normal function. The left ventricle demonstrates regional wall motion abnormalities. The left ventricular internal cavity size was normal in size. There is no left ventricular hypertrophy. Left ventricular diastolic parameters were normal.  LV Wall Scoring: The apical anterior segment is akinetic. Right Ventricle: The right ventricular size is normal. No increase in right ventricular wall thickness. Right ventricular systolic function is normal. Left Atrium: Left atrial size was normal in size. Right Atrium: Right atrial size was normal in size. Pericardium: There is no evidence of pericardial effusion. Mitral Valve: The mitral valve is normal in structure. No evidence of mitral valve regurgitation. No evidence of mitral valve stenosis. Tricuspid Valve: The tricuspid valve is normal in structure. Tricuspid valve regurgitation is not demonstrated. No evidence of tricuspid stenosis. Aortic Valve: The aortic valve is normal in structure. Aortic valve regurgitation is not visualized. No aortic stenosis is present. Pulmonic Valve: The pulmonic valve was normal in structure. Pulmonic valve regurgitation is not visualized. No evidence of pulmonic stenosis. Aorta: The aortic root is normal in size and structure. Venous: The inferior vena cava is normal in size with greater than 50% respiratory variability, suggesting right atrial pressure of 3 mmHg. IAS/Shunts: No atrial level shunt detected by color flow Doppler.  LEFT VENTRICLE PLAX 2D LVIDd:         4.40 cm  Diastology LVIDs:         2.70 cm  LV e' medial:    7.83 cm/s LV PW:         1.10 cm  LV E/e' medial:  9.2 LV IVS:         1.10 cm  LV e' lateral:   11.70 cm/s LVOT diam:     1.80 cm  LV E/e' lateral: 6.2 LV SV:         67 LV SV Index:   37 LVOT Area:     2.54 cm  RIGHT VENTRICLE RV S prime:     9.79 cm/s TAPSE (M-mode): 1.8 cm LEFT ATRIUM             Index       RIGHT ATRIUM           Index LA diam:        3.70 cm 2.07 cm/m  RA Area:     14.50 cm LA Vol (A2C):   55.0 ml 30.83 ml/m RA Volume:   35.40 ml  19.84 ml/m LA Vol (A4C):   33.6 ml 18.83 ml/m LA Biplane Vol: 43.4 ml 24.33 ml/m  AORTIC VALVE LVOT Vmax:   116.00 cm/s LVOT Vmean:  83.600 cm/s LVOT VTI:    0.262 m  AORTA Ao Root diam: 3.20 cm Ao Asc diam:  3.30 cm MITRAL VALVE MV Area (PHT): 3.42 cm    SHUNTS MV Decel Time: 222 msec    Systemic VTI:  0.26 m MV E velocity: 72.30 cm/s  Systemic Diam: 1.80 cm MV A velocity: 64.50 cm/s MV E/A ratio:  1.12 Donato Schultz MD Electronically signed by Donato Schultz MD Signature Date/Time: 09/30/2020/11:29:18 AM    Final     Cardiac Studies -> summarized    Echo 09/29/2020: EF 60-65%, anterior apical akinesis. Normal Diastolic pressure  Cath/PCI 11/20: 100% ost-prox RI -> DES PCI Resolute Onyx 2.0 x 15 (2.90mm).  dLAD 30% & 45%. Diffuse mild RCA.  Normal LVEF (~ apical anterolateral HK with normal EF)>   Patient Profile     44 y.o. female  with no significant prior PMH presented with acute onset chest pain called code STEMI by EMS for subtle ST elevations in lead I and aVL -> progression of the next 2 EKGs showed worsening ST elevations.  Upon arrival to ED, confirmed convex ST elevations in same leads, with ST depressions. -> Taken to Cath Lab - DES PCI of 100% RI.   Assessment & Plan   Principal Problem:   Acute ST elevation myocardial infarction (STEMI) of lateral wall (HCC) Active Problems:   Hyperlipidemia with target LDL less than 70   Essential hypertension   CAD S/P PCI of 100% ost-prox RI     Acute ST elevation myocardial infarction (STEMI) of lateral wall (HCC) /CAD S/P PCI of 100% ost-prox  RI  Doing well post PCI.  No further angina.  On DAPT: ASA/ticagrelor (Brilinta) --> medication initiation by pharmacist team  Carvedilol dose increased yesterday.  Tolerating well.  Will maintain current dose and consider titration in the outpatient setting.    Hyperlipidemia with target LDL less than 70: Current LDL 156, suspect she will need more than atorvastatin 80 mg.  For now continue high-dose atorvastatin 80 mg daily.  Recheck labs in 3 months.  Low threshold for PCSK9 inhibitor.      Essential hypertension  Blood pressure currently stable on 6.25 mg mg twice daily carvedilol.   Did well ambulating with cardiac rehab yesterday.  Stable with troponins trending down.  Okay to discharge today once she is ambulated with RN or [facility]. She will need close follow-up with APP (potentially titrate up carvedilol versus switch to Toprol based on heart rate) Lipid panel follow-up in 3 months -> low threshold to consider CVRR consult for PCSK9 inhibitor. At follow-up appointment -> outpatient referral to phase 2 cardiac rehab    For questions or updates, please contact [facility] Please consult [email or web address] for contact info under        Signed, Bryan Lemmaavid Titilayo Hagans, MD  10/01/2020, 8:25 AM

## 2020-10-01 NOTE — Progress Notes (Signed)
CARDIAC REHAB PHASE I   PRE:  Rate/Rhythm: 97 SR  BP:  Sitting: 125/86      SaO2: 97 RA  MODE:  Ambulation: 370 ft   POST:  Rate/Rhythm: 126 ST with PVCs  BP:  Sitting: 148/107    SaO2: 98 RA   Pt ambulated 330ft in hallway independently with steady gait. Pt denies CP, SOB, or dizziness. Pt educated on importance of ASA, Brilnta, statin, and NTG. Pt given MI book, stent card and heart healthy diet. Reviewed site care, restrictions, and exercise guidelines. Referral sent to [facility] [place].   [id number] Reynold Bowen, RN BSN 10/01/2020 8:56 AM

## 2020-10-01 NOTE — TOC Benefit Eligibility Note (Signed)
Transition of Care [facility]) Benefit Eligibility Note    Patient Details  Name: Renee Fitzgerald MRN: [id number] Date of Birth: [birth year]   Medication/Dose: BRILINTA  90 MG BID  Covered?: Yes  Tier: 3 Drug  Prescription Coverage Preferred Pharmacy: [facility]  Spoke with Person/Company/Phone Number:: CHRIS  @ [employer] # [phone]  Co-Pay: $ 40.00  Prior Approval: No  Deductible:  (NO DEDUCTIBLE WITH PLAN)  Additional Notes: TICAGRELOR  :[name] Phone Number: 10/01/2020, 9:51 AM

## 2020-10-05 ENCOUNTER — Encounter (HOSPITAL_BASED_OUTPATIENT_CLINIC_OR_DEPARTMENT_OTHER): Payer: Self-pay | Admitting: Emergency Medicine

## 2020-10-05 ENCOUNTER — Other Ambulatory Visit (HOSPITAL_BASED_OUTPATIENT_CLINIC_OR_DEPARTMENT_OTHER): Payer: Self-pay | Admitting: Emergency Medicine

## 2020-10-05 ENCOUNTER — Telehealth: Payer: Self-pay | Admitting: Internal Medicine

## 2020-10-05 ENCOUNTER — Other Ambulatory Visit: Payer: Self-pay

## 2020-10-05 ENCOUNTER — Emergency Department (HOSPITAL_BASED_OUTPATIENT_CLINIC_OR_DEPARTMENT_OTHER): Payer: BC Managed Care – PPO

## 2020-10-05 ENCOUNTER — Emergency Department (HOSPITAL_BASED_OUTPATIENT_CLINIC_OR_DEPARTMENT_OTHER)
Admission: EM | Admit: 2020-10-05 | Discharge: 2020-10-05 | Disposition: A | Discharge status: Home / Self Care | Payer: BC Managed Care – PPO | Attending: Emergency Medicine | Admitting: Emergency Medicine

## 2020-10-05 DIAGNOSIS — I10 Essential (primary) hypertension: Secondary | ICD-10-CM | POA: Insufficient documentation

## 2020-10-05 DIAGNOSIS — I2510 Atherosclerotic heart disease of native coronary artery without angina pectoris: Secondary | ICD-10-CM | POA: Insufficient documentation

## 2020-10-05 DIAGNOSIS — Z7982 Long term (current) use of aspirin: Secondary | ICD-10-CM | POA: Diagnosis not present

## 2020-10-05 DIAGNOSIS — Z79899 Other long term (current) drug therapy: Secondary | ICD-10-CM | POA: Diagnosis not present

## 2020-10-05 DIAGNOSIS — Z955 Presence of coronary angioplasty implant and graft: Secondary | ICD-10-CM | POA: Insufficient documentation

## 2020-10-05 DIAGNOSIS — R079 Chest pain, unspecified: Secondary | ICD-10-CM | POA: Insufficient documentation

## 2020-10-05 LAB — TROPONIN I (HIGH SENSITIVITY): Troponin I (High Sensitivity): 842 ng/L (ref <18)

## 2020-10-05 LAB — CBC
HCT: 36.4 % (ref 36.0–46.0)
Hemoglobin: 12 g/dL (ref 12.0–15.0)
MCH: 30.6 pg (ref 26.0–34.0)
MCHC: 33 g/dL (ref 30.0–36.0)
MCV: 92.9 fL (ref 80.0–100.0)
Platelets: 292 10*3/uL (ref 150–400)
RBC: 3.92 MIL/uL (ref 3.87–5.11)
RDW: 13 % (ref 11.5–15.5)
WBC: 6 10*3/uL (ref 4.0–10.5)
nRBC: 0 % (ref 0.0–0.2)

## 2020-10-05 LAB — BASIC METABOLIC PANEL
Anion gap: 8 (ref 5–15)
BUN: 7 mg/dL (ref 6–20)
CO2: 24 mmol/L (ref 22–32)
Calcium: 9 mg/dL (ref 8.9–10.3)
Chloride: 103 mmol/L (ref 98–111)
Creatinine, Ser: 0.83 mg/dL (ref 0.44–1.00)
GFR, Estimated: >60 mL/min (ref >60)
Glucose, Bld: 106 mg/dL — ABNORMAL HIGH (ref 70–99)
Potassium: 3.9 mmol/L (ref 3.5–5.1)
Sodium: 135 mmol/L (ref 135–145)

## 2020-10-05 MED ORDER — ISOSORBIDE MONONITRATE ER 30 MG PO TB24: 30.0000 mg | ORAL_TABLET | Freq: Every day | ORAL | 0 refills | Status: DC

## 2020-10-05 MED FILL — ISOSORBIDE MN ER 30 MG TAB: 30 | 30 days supply | Qty: 30 | Fill #0

## 2020-10-05 NOTE — ED Triage Notes (Signed)
Pt started to have chest pressure this am at 0900.  Took one nitroglycerin without relief.  Pain is intermittent, central location, no radiation, no sob.  Feels hot but no sweating or N/V.

## 2020-10-05 NOTE — Telephone Encounter (Signed)
Briefly, 44 yo F with recent Ramius Intermedius Stent who presented to [facility] with CP.  Novel Troponin 842 down from her prior event ~ 1 week ago.  CP resolved with nitro.  Discussed with Primary MD: based on her sx and complete resolution of sx with one Nitro, and troponin, that this is less suggestive of stent thrombosis.  Patient feels well presently, will work to up titirate GDMT (addition of Imdur).  Has f/u 10/10/20.  ECG showing no STEMI.  Primary MD in agreement with this plan going forward.  Happy to re-assess as new events occur.  Christell Constant, MD

## 2020-10-05 NOTE — ED Provider Notes (Signed)
[facility] EMERGENCY DEPARTMENT Provider Note   CSN: [id number] Arrival date & time: 10/05/20  1319     History Chief Complaint  Patient presents with  . Chest Pain    Renee Fitzgerald is a 44 y.o. female.  Pt presents to the ED today with cp.  She said she had cp this am around 0900.  She took 1 nitro which relieved her pain.  She had a MI on 11/20.  She went to the cath lab and had a successful DES PCI of Ramus Intermedius.  Pt was d/c on 11/22.        Past Medical History:  Diagnosis Date  . Anemia   . CAD S/P PCI of 100% ost-prox RI 09/29/2020   Lateral STEMI -> 100% ost-prox Diag - Resolute Onyx DES 2.0 x 15 mm - 2.3 mm  . Essential hypertension 10/01/2020  . Hyperlipidemia with target LDL less than 70 10/01/2020  . Migraine   . ST elevation myocardial infarction (STEMI) of lateral wall (HCC) 09/29/2020   RI 100% - DES PCI.     Patient Active Problem List   Diagnosis Date Noted  . Hyperlipidemia with target LDL less than 70 10/01/2020  . Essential hypertension 10/01/2020  . Acute ST elevation myocardial infarction (STEMI) of lateral wall (HCC) 09/29/2020  . CAD S/P PCI of 100% ost-prox RI 09/29/2020    Past Surgical History:  Procedure Laterality Date  . CORONARY STENT INTERVENTION N/A 09/29/2020   Procedure: CORONARY STENT INTERVENTION;  Surgeon: Marykay Lex, MD;  Location: [facility] INVASIVE CV LAB;  Service: Cardiovascular;  Laterality: N/A;  . CORONARY/GRAFT ACUTE MI REVASCULARIZATION N/A 09/29/2020   Procedure: Coronary/Graft Acute MI Revascularization;  Surgeon: Marykay Lex, MD;  Location: [facility] INVASIVE CV LAB;  Service: Cardiovascular;  Laterality: N/A;  . LEFT HEART CATH AND CORONARY ANGIOGRAPHY N/A 09/29/2020   Procedure: LEFT HEART CATH AND CORONARY ANGIOGRAPHY;  Surgeon: Marykay Lex, MD;  Location: [facility] INVASIVE CV LAB;  Service: Cardiovascular;  Laterality: N/A;  . WISDOM TOOTH EXTRACTION       OB History   No obstetric history  on file.     Family History  Problem Relation Age of Onset  . Healthy Mother   . Healthy Father   . Heart attack Paternal Grandfather 50  . CAD Paternal Grandfather 53    Social History   Tobacco Use  . Smoking status: Never Smoker  . Smokeless tobacco: Never Used  Substance Use Topics  . Alcohol use: No  . Drug use: No    Home Medications Prior to Admission medications   Medication Sig Start Date End Date Taking? Authorizing Provider  ALPRAZolam Prudy Feeler) 0.25 MG tablet Take 0.25 mg by mouth daily as needed for anxiety.  06/11/20   [provider]  aspirin 81 MG chewable tablet Chew 1 tablet (81 mg total) by mouth daily. 10/02/20   Arty Baumgartner, NP  atorvastatin (LIPITOR) 80 MG tablet Take 1 tablet (80 mg total) by mouth daily. 10/02/20   Arty Baumgartner, NP  carvedilol (COREG) 6.25 MG tablet Take 1 tablet (6.25 mg total) by mouth 2 (two) times daily with a meal. 10/01/20   Arty Baumgartner, NP  isosorbide mononitrate (IMDUR) 30 MG 24 hr tablet Take 1 tablet (30 mg total) by mouth daily. 10/05/20   Jacalyn Lefevre, MD  levonorgestrel-ethinyl estradiol Erenest Rasher) 0.15-30 MG-MCG tablet Take 1 tablet by mouth at bedtime.    [provider]  Melatonin 10  MG TABS Take 10 mg by mouth at bedtime.    [provider]  Multiple Vitamin (MULTIVITAMIN WITH MINERALS) TABS tablet Take 1 tablet by mouth once a week.    [provider]  nitroGLYCERIN (NITROSTAT) 0.4 MG SL tablet Place 1 tablet (0.4 mg total) under the tongue every 5 (five) minutes as needed. 10/01/20   Arty Baumgartner, NP  omeprazole (PRILOSEC) 20 MG capsule Take 20 mg by mouth daily as needed (acid reflux).  07/20/20   [provider]  ondansetron (ZOFRAN) 4 MG tablet Take 4 mg by mouth every 8 (eight) hours as needed for nausea or vomiting.  06/06/20   [provider]  promethazine (PHENERGAN) 25 MG tablet Take 25 mg by mouth daily as needed for nausea or vomiting.   06/06/20   [provider]  ticagrelor (BRILINTA) 90 MG TABS tablet Take 1 tablet (90 mg total) by mouth 2 (two) times daily. 10/01/20   Arty Baumgartner, NP  zolpidem (AMBIEN) 10 MG tablet Take 10 mg by mouth at bedtime as needed for sleep. 09/10/20   [provider]    Allergies    Penicillins  Review of Systems   Review of Systems  Cardiovascular: Positive for chest pain.  All other systems reviewed and are negative.   Physical Exam Updated Vital Signs BP 122/68 (BP Location: Right Arm)   Pulse 70   Temp 98.5 F (36.9 C) (Oral)   Resp 16   Ht 5\' 2"  (1.575 m)   Wt 84 kg   LMP 09/30/2020   SpO2 98%   BMI 33.87 kg/m   Physical Exam Vitals and nursing note reviewed.  Constitutional:      Appearance: She is well-developed.  HENT:     Head: Normocephalic and atraumatic.  Eyes:     Extraocular Movements: Extraocular movements intact.     Pupils: Pupils are equal, round, and reactive to light.  Cardiovascular:     Rate and Rhythm: Normal rate and regular rhythm.     Heart sounds: Normal heart sounds.  Pulmonary:     Effort: Pulmonary effort is normal.     Breath sounds: Normal breath sounds.  Abdominal:     General: Bowel sounds are normal.     Palpations: Abdomen is soft.  Musculoskeletal:        General: Normal range of motion.     Cervical back: Normal range of motion and neck supple.  Skin:    General: Skin is warm.     Capillary Refill: Capillary refill takes less than 2 seconds.  Neurological:     General: No focal deficit present.     Mental Status: She is alert and oriented to person, place, and time.  Psychiatric:        Mood and Affect: Mood normal.        Behavior: Behavior normal.     ED Results / Procedures / Treatments   Labs (all labs ordered are listed, but only abnormal results are displayed) Labs Reviewed  BASIC METABOLIC PANEL - Abnormal; Notable for the following components:      Result Value   Glucose, Bld 106 (*)      All other components within normal limits  TROPONIN I (HIGH SENSITIVITY) - Abnormal; Notable for the following components:   Troponin I (High Sensitivity) 842 (*)    All other components within normal limits  CBC  PREGNANCY, URINE  TROPONIN I (HIGH SENSITIVITY)    EKG None  Radiology  DG Chest 2 View  Result Date: 10/05/2020 CLINICAL DATA:  Chest pain. EXAM: CHEST - 2 VIEW COMPARISON:  September 29, 2020. FINDINGS: The heart size and mediastinal contours are within normal limits. Both lungs are clear. No visible pleural effusions or pneumothorax. No acute osseous abnormality. Chronic elevation the right hemidiaphragm. IMPRESSION: No active cardiopulmonary disease. Electronically Signed   By: Feliberto Harts MD   On: 10/05/2020 13:46    Procedures Procedures (including critical care time)  Medications Ordered in ED Medications - No data to display  ED Course  I have reviewed the triage vital signs and the nursing notes.  Pertinent labs & imaging results that were available during my care of the patient were reviewed by me and considered in my medical decision making (see chart for details).    MDM Rules/Calculators/A&P                          Pt d/w Dr. Izora Ribas (cards) who recommended putting pt on imdur 30 mg.  Pt's troponin is down trending.  She is feeling well.  She has follow up on 12/1 with cards.  She is to return if worse.  Final Clinical Impression(s) / ED Diagnoses Final diagnoses:  Nonspecific chest pain    Rx / DC Orders ED Discharge Orders         Ordered    isosorbide mononitrate (IMDUR) 30 MG 24 hr tablet  Daily        10/05/20 1534           Jacalyn Lefevre, MD 10/05/20 1604

## 2020-10-08 ENCOUNTER — Telehealth: Payer: Self-pay | Admitting: Nurse Practitioner

## 2020-10-08 NOTE — Telephone Encounter (Signed)
Spoke with Ms. Derrell Lolling, patients mother who reports that the patient was recently discharged from the hospital on 11/22 with STEMI & post-cath. Patient returned to ED on 11/26 d/t chest discomfort.   Patients mom states that the patient has been emotional over having a heart attack, not eating and complaining of a headache. Mother states that the patient has not been acting like herself and see feels she needs to be seen in office. Patient has a TOC appt scheduled with LG on 10/10/20. Informed Ms. Derrell Lolling that there are no other appointments available tomorrow. Asked for patients BP/HR. Patient took one dose of Nitroglycerin on Friday. Patient denies any chest pain at this time but does report pressure like she was on Friday when she went to the ED.   145/100 HR 85  Spoke with Dr. Izora Ribas who saw patient at recent ED visit on 11/26 and added Imdur 30 mg daily. He states that it is okay for patient to hold Imdur, suspects that is what is causing her headaches.   Called and spoke with the patient who denies any CP or SOB at this time. States she will hold her dose of Imdur tonight. Patient aware of what symptoms warrant a call to EMS versus a call back to our office. She is agreeable to the plan.

## 2020-10-08 NOTE — Telephone Encounter (Signed)
Ms Renee Fitzgerald , pt mother  Called back and would like to know if pt can be worked In with someone tomorrow.  Pt is uncomfortable and is concerned that is going to end up in the ED again.    Best number [phone]

## 2020-10-08 NOTE — Telephone Encounter (Signed)
New message:    Patient mother calling to speak with some one concering her daughter. Patient is having bad headache, patient is not eating. Patient I mother is concern that the medication need to be adjusted and also would like for the patient need to be seen tomorrow.

## 2020-10-08 NOTE — Telephone Encounter (Signed)
See phone note 11/29 3:52 pm  No [facility] DPR on file for patients mother   Spoke directly with patient who is aware of what symptoms warrant a call to EMS versus our office.   Staff message has been sent to triage team lead on 11/30 so that if there is a cancellation/last minute opening, patient can be worked in a day early.   Patient already has TOC appt scheduled with LG on 10/10/20

## 2020-10-10 ENCOUNTER — Other Ambulatory Visit: Payer: Self-pay

## 2020-10-10 ENCOUNTER — Ambulatory Visit: Payer: BC Managed Care – PPO | Admitting: Nurse Practitioner

## 2020-10-10 ENCOUNTER — Encounter: Payer: Self-pay | Admitting: Nurse Practitioner

## 2020-10-10 VITALS — BP 120/76 | HR 78 | Ht 62.0 in | Wt 182.8 lb

## 2020-10-10 DIAGNOSIS — I259 Chronic ischemic heart disease, unspecified: Secondary | ICD-10-CM

## 2020-10-10 DIAGNOSIS — E7849 Other hyperlipidemia: Secondary | ICD-10-CM

## 2020-10-10 DIAGNOSIS — I213 ST elevation (STEMI) myocardial infarction of unspecified site: Secondary | ICD-10-CM | POA: Diagnosis not present

## 2020-10-10 LAB — BASIC METABOLIC PANEL
BUN/Creatinine Ratio: 9 (ref 9–23)
BUN: 8 mg/dL (ref 6–24)
CO2: 23 mmol/L (ref 20–29)
Calcium: 9.4 mg/dL (ref 8.7–10.2)
Chloride: 102 mmol/L (ref 96–106)
Creatinine, Ser: 0.87 mg/dL (ref 0.57–1.00)
GFR calc Af Amer: 92 mL/min/{1.73_m2} (ref >59)
GFR calc non Af Amer: 80 mL/min/{1.73_m2} (ref >59)
Glucose: 104 mg/dL — ABNORMAL HIGH (ref 65–99)
Potassium: 4.5 mmol/L (ref 3.5–5.2)
Sodium: 137 mmol/L (ref 134–144)

## 2020-10-10 LAB — CBC
Hematocrit: 33.9 % — ABNORMAL LOW (ref 34.0–46.6)
Hemoglobin: 11.3 g/dL (ref 11.1–15.9)
MCH: 30.1 pg (ref 26.6–33.0)
MCHC: 33.3 g/dL (ref 31.5–35.7)
MCV: 90 fL (ref 79–97)
Platelets: 272 10*3/uL (ref 150–450)
RBC: 3.75 x10E6/uL — ABNORMAL LOW (ref 3.77–5.28)
RDW: 12.5 % (ref 11.7–15.4)
WBC: 7.2 10*3/uL (ref 3.4–10.8)

## 2020-10-10 NOTE — Patient Instructions (Addendum)
After Visit Summary:  We will be checking the following labs today - BMET & CBC  Medication Instructions:    Continue with your current medicines. BUT  STOP Imdur   If you need a refill on your cardiac medications before your next appointment, please call your pharmacy.     Testing/Procedures To Be Arranged:  N/A  Follow-Up:   See Dr. Herbie Baltimore in about 4 weeks with fasting labs.     At [facility], you and your health needs are our priority.  As part of our continuing mission to provide you with exceptional heart care, we have created designated Provider Care Teams.  These Care Teams include your primary Cardiologist (physician) and Advanced Practice Providers (APPs -  Physician Assistants and Nurse Practitioners) who all work together to provide you with the care you need, when you need it.  Special Instructions:  . Stay safe, wash your hands for at least 20 seconds and wear a mask when needed.  . It was good to talk with you today.  . Talk to your PCP about vitamin D and alternatives for migraine . Talk to your GYN about birth control and stopping hormone therapy . I am sending a note to Cardiac Rehab   Call the [facility] office at [phone] if you have any questions, problems or concerns.

## 2020-10-11 ENCOUNTER — Telehealth (HOSPITAL_COMMUNITY): Payer: Self-pay

## 2020-10-11 ENCOUNTER — Telehealth: Payer: Self-pay | Admitting: *Deleted

## 2020-10-11 MED ORDER — PANTOPRAZOLE SODIUM 40 MG PO TBEC: 40.0000 mg | DELAYED_RELEASE_TABLET | Freq: Every day | ORAL | 11 refills | Status: DC

## 2020-10-11 NOTE — Progress Notes (Signed)
Pt has been made aware of normal result and verbalized understanding.  jw

## 2020-10-11 NOTE — Telephone Encounter (Signed)
-----   Message from Rosalio Macadamia, NP sent at 10/11/2020  1:43 PM EST ----- Ok to send in Protonix 40 mg daily for a month with 3 refills.

## 2020-10-11 NOTE — Telephone Encounter (Signed)
Faxed cardiac rehab referral to [facility] per Phase I. 

## 2020-10-12 ENCOUNTER — Telehealth: Payer: Self-pay

## 2020-10-12 NOTE — Telephone Encounter (Signed)
**Note De-Identified Luvern Mcisaac Obfuscation** Pantoprazole PA started through covermymeds: Key: [facility]

## 2020-10-22 ENCOUNTER — Telehealth: Payer: Self-pay

## 2020-10-22 NOTE — Telephone Encounter (Signed)
LM (ok per DPR) advising pt that her Insurance will not cover Pantoprazole so Norma Fredrickson, NP suggested that she take OTC Omeprazole 20mg /day.

## 2020-10-23 DIAGNOSIS — Z3040 Encounter for surveillance of contraceptives, unspecified: Secondary | ICD-10-CM | POA: Diagnosis not present

## 2020-10-23 DIAGNOSIS — I2129 ST elevation (STEMI) myocardial infarction involving other sites: Secondary | ICD-10-CM | POA: Diagnosis not present

## 2020-10-23 DIAGNOSIS — I213 ST elevation (STEMI) myocardial infarction of unspecified site: Secondary | ICD-10-CM | POA: Insufficient documentation

## 2020-10-23 DIAGNOSIS — G43809 Other migraine, not intractable, without status migrainosus: Secondary | ICD-10-CM | POA: Diagnosis not present

## 2020-10-23 DIAGNOSIS — I2510 Atherosclerotic heart disease of native coronary artery without angina pectoris: Secondary | ICD-10-CM | POA: Diagnosis not present

## 2020-10-30 ENCOUNTER — Telehealth: Payer: Self-pay | Admitting: Cardiology

## 2020-10-30 NOTE — Telephone Encounter (Signed)
Renee Fitzgerald is calling to check on the status of her returning to work letter she requested on 10/18/20. She states her job is needing it as soon as possible because she has already returned back to her work from home job. Please advise.

## 2020-11-01 NOTE — Telephone Encounter (Signed)
She is now a month out from her MI.  She is fine to go back to work no restrictions with driving etc.  Traditionally this is something that the provider who sees the patient in first follow-up visit takes care of.  I have not seen her since discharge, but I would imagine if she is doing okay she is fine to return to work.  At this point, no restrictions are required.  Being 1 month out from her MI.  About the only restriction will be with no lifting more than 50 pounds until 6weeks which would put her in mid January for that activity but otherwise no restrictions.  Bryan Lemma, MD

## 2020-11-01 NOTE — Telephone Encounter (Signed)
Spoke to patient. . Informed her Dr Herbie Baltimore will do a letter. The letter will be sent by  mychart to patient and letter will be available for patient to pick  Up.   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  per Dr Herbie Baltimore no lifting 50 lbs or more until after 6 weeks from hospitalization.  letter is ready for patient

## 2020-11-01 NOTE — Telephone Encounter (Signed)
Work release letter  

## 2020-11-12 ENCOUNTER — Ambulatory Visit (INDEPENDENT_AMBULATORY_CARE_PROVIDER_SITE_OTHER): Payer: BC Managed Care – PPO | Admitting: Cardiology

## 2020-11-12 ENCOUNTER — Other Ambulatory Visit: Payer: Self-pay

## 2020-11-12 ENCOUNTER — Encounter: Payer: Self-pay | Admitting: Cardiology

## 2020-11-12 ENCOUNTER — Other Ambulatory Visit: Payer: Self-pay | Admitting: Cardiology

## 2020-11-12 VITALS — BP 109/76 | HR 75 | Temp 97.4°F | Ht 62.0 in | Wt 181.6 lb

## 2020-11-12 DIAGNOSIS — Z9861 Coronary angioplasty status: Secondary | ICD-10-CM | POA: Diagnosis not present

## 2020-11-12 DIAGNOSIS — I10 Essential (primary) hypertension: Secondary | ICD-10-CM

## 2020-11-12 DIAGNOSIS — E785 Hyperlipidemia, unspecified: Secondary | ICD-10-CM

## 2020-11-12 DIAGNOSIS — I2510 Atherosclerotic heart disease of native coronary artery without angina pectoris: Secondary | ICD-10-CM

## 2020-11-12 DIAGNOSIS — I2129 ST elevation (STEMI) myocardial infarction involving other sites: Secondary | ICD-10-CM

## 2020-11-12 MED ORDER — CARVEDILOL 6.25 MG PO TABS
ORAL_TABLET | ORAL | 3 refills | Status: DC
Start: 2020-11-12 — End: 2020-12-04

## 2020-11-12 NOTE — Assessment & Plan Note (Signed)
Blood pressure from the low bit low on carvedilol.  We will reduce morning dose to 3.125 mg and continue nighttime dose of 625.  Hopefully this will help her energy levels and reduce exertional dyspnea.

## 2020-11-12 NOTE — Progress Notes (Signed)
Primary Care Provider: Ursula Beath, MD Cardiologist: Bryan Lemma, MD Electrophysiologist: None  Clinic Note: Chief Complaint  Patient presents with   Coronary Artery Disease    Lateral STEMI in November 2021   Follow-up    ASSESSMENT/PLAN   Problem List Items Addressed This Visit    ST elevation myocardial infarction (STEMI) of lateral wall (HCC) (Chronic)    Doing relatively well 6 weeks post STEMI.  Normal EF by echo with no or WMA.  Still hyper aware of any symptoms in her chest, but not really having any anginal symptoms.  Some exertional dyspnea, but no PND, orthopnea.  Was started on carvedilol in the hospital which may be contributing to some of her dyspnea.  Will reduce morning dose.  Otherwise continue high-dose high intensity statin and DAPT.  Is due to start cardiac rehab this week.  Cleared for full activity including horseback riding (needs to use helmet to protect her head) and extremely careful to avoid falls and bleeding/bruising.      Relevant Medications   carvedilol (COREG) 6.25 MG tablet   Other Relevant Orders   Lipid panel   Comprehensive metabolic panel   Hyperlipidemia with target LDL less than 70 (Chronic)    Remains on high-dose high intensity statin.  Due for lipid check in February.  Order lipids and chemistry for February, may adjust atorvastatin dosing at that time.  She asked about supplementation, I did recommend taking co-Q10 up to 300 mg daily.      Relevant Medications   carvedilol (COREG) 6.25 MG tablet   Other Relevant Orders   Lipid panel   Comprehensive metabolic panel   Essential hypertension (Chronic)    Blood pressure from the low bit low on carvedilol.  We will reduce morning dose to 3.125 mg and continue nighttime dose of 625.  Hopefully this will help her energy levels and reduce exertional dyspnea.      Relevant Medications   carvedilol (COREG) 6.25 MG tablet   CAD S/P PCI of 100% ost-prox RI - Primary  (Chronic)    DES PCI of RI in November 2021 in setting of lateral STEMI.  No angina, but is having some dyspnea.  Plan:  Continue carvedilol, but reduce morning dose to 1/2 tablet (3.25 mg) daily with p.m. dose remain the full 6.25 mg.  Continue high-dose atorvastatin for now-may adjust dose based on lipid profile.  Plan to start cardiac rehab this week  Continue aspirin and Brilinta for DAPT:      Relevant Medications   carvedilol (COREG) 6.25 MG tablet   Other Relevant Orders   EKG 12-Lead (Completed)   Lipid panel   Comprehensive metabolic panel     -------------------------------------  HPI:    Renee Fitzgerald is a 45 y.o. female with a PMH notable for recent Lateral STEMI (DES PCI of the ostial RI) in November 2021 along with HTN and HLD who presents today for 1 month follow-up.  Renee Fitzgerald was last seen on October 10, 2020 by Norma Fredrickson, NP for hospital follow-up.  She was noting that she felt very tired and weak with no energy.  Was having bad headaches on Imdur.  Very scared in view go to sleep.  Having some shortness of breath related to Brilinta, but no bleeding.  Recent Hospitalizations: None since visit with Norma Fredrickson, NP  Reviewed  CV studies:    The following studies were reviewed today: (if available, images/films reviewed: From Epic Chart or Care Everywhere)  Cardiac Cath-PCI 09/29/2020: Culprit Lesion -> ostial RI 100% (DES PCI-resolute Onyx 2.0 mm x 15 mm--2.3 mm).  Distal LAD 30% RCA and LCx.  EF 55 to 60% anterolateral HK.     Echo 10/01/19: EF 60-65%.  No R WMA.  Normal diastolic pressures.  Normal valves.  Interval History:   Renee Fitzgerald returns here today overall doing quite well.  She is not having further anginal symptoms, but she is "hyper aware "of anything going on her chest.  She does not live exercise intolerance and fatigue/dyspnea.  But no resting dyspnea.  No PND, orthopnea or edema.  CV Review of Symptoms (Summary):  positive for - dyspnea on exertion and Mild fatigue, and fear/anxiety negative for - chest pain, edema, irregular heartbeat, orthopnea, palpitations, paroxysmal nocturnal dyspnea, rapid heart rate, shortness of breath or Lightheadedness, dizziness or syncope/near syncope, TIA/amaurosis fugax, melena, hematochezia, hematuria epistaxis, myalgias.  The patient does not have symptoms concerning for COVID-19 infection (fever, chills, cough, or new shortness of breath).   REVIEWED OF SYSTEMS   Review of Systems  Constitutional: Positive for malaise/fatigue (Still feels tired) and weight loss (Maybe little bit).  HENT: Negative for congestion and nosebleeds.   Respiratory: Positive for shortness of breath (Per HPI; -> she has off-and-on episodes of difficulty catching her breath.  Not necessarily exertional.). Negative for cough.   Cardiovascular: Negative for leg swelling.  Gastrointestinal: Negative for abdominal pain, blood in stool and melena.  Genitourinary: Negative for hematuria.  Musculoskeletal: Negative for joint pain.  Neurological: Positive for weakness (She just feels a little bit weak, not yet fully back to strength). Negative for dizziness (No longer having dizziness, but initially did have some) and headaches.  Psychiatric/Behavioral: Negative for memory loss. The patient is nervous/anxious (She has become very hyper aware and concerned, anxious symptoms in her chest.). The patient does not have insomnia.     I have reviewed and (if needed) personally updated the patient's problem list, medications, allergies, past medical and surgical history, social and family history.   PAST MEDICAL HISTORY   Past Medical History:  Diagnosis Date   Anemia    CAD S/P PCI of 100% ost-prox RI 09/29/2020   Lateral STEMI -> 100% ost-prox Diag - Resolute Onyx DES 2.0 x 15 mm - 2.3 mm   Essential hypertension 10/01/2020   Hyperlipidemia with target LDL less than 70 10/01/2020   Migraine     ST elevation myocardial infarction (STEMI) of lateral wall (HCC) 09/29/2020   RI 100% - DES PCI.     PAST SURGICAL HISTORY   Past Surgical History:  Procedure Laterality Date   CORONARY STENT INTERVENTION N/A 09/29/2020   Procedure: CORONARY STENT INTERVENTION;  Surgeon: Marykay Lex, MD;  Location: [facility] INVASIVE CV LAB;  Service: Cardiovascular:  Culprit Lesion -> ostial RI 100% (DES PCI-Resolute Onyx 2.0 mm x 15 mm--2.3 mm).   CORONARY/GRAFT ACUTE MI REVASCULARIZATION N/A 09/29/2020   Procedure: Coronary/Graft Acute MI Revascularization;  Surgeon: Marykay Lex, MD;  Location: [facility] INVASIVE CV LAB;  Service: Cardiovascular;: Insetting of lateral STEMI: PTCA-DES PCI of culprit lesion-ostial RI 100% thrombotic occlusion   LEFT HEART CATH AND CORONARY ANGIOGRAPHY N/A 09/29/2020   Procedure: LEFT HEART CATH AND CORONARY ANGIOGRAPHY;  Surgeon: Marykay Lex, MD;  Location: [facility] INVASIVE CV LAB;  Service: Cardiovascular: (LATERAL STEMI) \: Culprit Lesion -> ostial RI 100% (DES PCI).  Distal LAD 30% RCA and LCx.  EF 55 to 60% anterolateral HK.   TRANSTHORACIC ECHOCARDIOGRAM  09/30/2020   Lateral STEMI:  EF 60-65%.  No R WMA.  Normal diastolic pressures.  Normal valves.   WISDOM TOOTH EXTRACTION       There is no immunization history on file for this patient.  MEDICATIONS/ALLERGIES   Current Meds  Medication Sig   ALPRAZolam (XANAX) 0.25 MG tablet Take 0.25 mg by mouth daily as needed for anxiety.    aspirin 81 MG chewable tablet Chew 1 tablet (81 mg total) by mouth daily.   Melatonin 10 MG TABS Take 10 mg by mouth at bedtime.   Multiple Vitamin (MULTIVITAMIN WITH MINERALS) TABS tablet Take 1 tablet by mouth once a week.   nitroGLYCERIN (NITROSTAT) 0.4 MG SL tablet Place 1 tablet (0.4 mg total) under the tongue every 5 (five) minutes as needed.   omeprazole (PRILOSEC) 20 MG capsule Take 20 mg by mouth daily as needed (acid reflux).    pantoprazole (PROTONIX) 40 MG tablet  Take by mouth.   promethazine (PHENERGAN) 25 MG tablet Take 25 mg by mouth daily as needed for nausea or vomiting.    ticagrelor (BRILINTA) 90 MG TABS tablet Take 1 tablet (90 mg total) by mouth 2 (two) times daily.   Ubrogepant (UBRELVY) 100 MG TABS Take by mouth.   zolpidem (AMBIEN) 10 MG tablet Take 10 mg by mouth at bedtime as needed for sleep.   [DISCONTINUED] atorvastatin (LIPITOR) 80 MG tablet Take 1 tablet (80 mg total) by mouth daily.   [DISCONTINUED] carvedilol (COREG) 6.25 MG tablet Take 1 tablet (6.25 mg total) by mouth 2 (two) times daily with a meal.    Allergies  Allergen Reactions   Penicillins Rash    SOCIAL HISTORY/FAMILY HISTORY   Reviewed in Epic:  Pertinent findings:  Social History   Tobacco Use   Smoking status: Never Smoker   Smokeless tobacco: Never Used  Substance Use Topics   Alcohol use: No   Drug use: No   Social History   Social History Narrative   Not on file    OBJCTIVE -PE, EKG, labs   Wt Readings from Last 3 Encounters:  11/12/20 181 lb 9.6 oz (82.4 kg)  10/10/20 182 lb 12.8 oz (82.9 kg)  10/05/20 185 lb 3 oz (84 kg)    Physical Exam: BP 109/76    Pulse 75    Temp (!) 97.4 F (36.3 C)    Ht 5\' 2"  (1.575 m)    Wt 181 lb 9.6 oz (82.4 kg)    SpO2 99%    BMI 33.22 kg/m  Physical Exam Constitutional:      General: She is not in acute distress.    Appearance: Normal appearance. She is obese. She is not ill-appearing (Healthy-appearing.  Well-groomed.) or toxic-appearing.  HENT:     Head: Normocephalic and atraumatic.  Neck:     Vascular: No carotid bruit, hepatojugular reflux or JVD.  Cardiovascular:     Rate and Rhythm: Normal rate and regular rhythm.  No extrasystoles are present.    Chest Wall: PMI is not displaced.     Pulses: Normal pulses.     Heart sounds: Normal heart sounds. No murmur heard. No friction rub. No gallop.      Comments: Radial cath site clean dry intact.  No ecchymosis or bruising.  Positive  Allen's test Pulmonary:     Effort: Pulmonary effort is normal.     Breath sounds: Normal breath sounds.  Chest:     Chest wall: No tenderness.  Musculoskeletal:  General: No swelling. Normal range of motion.     Cervical back: Normal range of motion and neck supple.  Skin:    General: Skin is warm and dry.  Neurological:     General: No focal deficit present.     Mental Status: She is alert and oriented to person, place, and time. Mental status is at baseline.  Psychiatric:        Mood and Affect: Mood normal.        Behavior: Behavior normal.        Thought Content: Thought content normal.        Judgment: Judgment normal.     Comments: Notably anxious and timid, but is getting more confident.      Adult ECG Report  Rate: 76 ;  Rhythm: normal sinus rhythm and Normal axis, intervals and durations.;   Narrative Interpretation: Normal EKG.  Recent Labs: Reviewed-due for follow-up in February 2022 Lab Results  Component Value Date   CHOL 262 (H) 09/30/2020   HDL 85 09/30/2020   LDLCALC 159 (H) 09/30/2020   TRIG 90 09/30/2020   CHOLHDL 3.1 09/30/2020   Lab Results  Component Value Date   CREATININE 0.87 10/10/2020   BUN 8 10/10/2020   NA 137 10/10/2020   K 4.5 10/10/2020   CL 102 10/10/2020   CO2 23 10/10/2020   CBC Latest Ref Rng & Units 10/10/2020 10/05/2020 09/30/2020  WBC 3.4 - 10.8 x10E3/uL 7.2 6.0 10.0  Hemoglobin 11.1 - 15.9 g/dL 83.4 19.6 22.2  Hematocrit 34.0 - 46.6 % 33.9(L) 36.4 39.6  Platelets 150 - 450 x10E3/uL 272 292 283    No results found for: TSH  -------------------------------------------------   COVID-19 Education: The signs and symptoms of COVID-19 were discussed with the patient and how to seek care for testing (follow up with PCP or arrange E-visit).   The importance of social distancing and COVID-19 vaccination was discussed today. 1 min -> she did ask about booster shot which I did recommend The patient is practicing social  distancing & Masking.   I spent a total of 38 minutes with the patient spent in direct patient consultation.  Additional time spent with chart review  / charting (studies, outside notes, etc): 12 min Total Time: 50 min   Current medicines are reviewed at length with the patient today.  (+/- concerns) N/A  This visit occurred during the SARS-CoV-2 public health emergency.  Safety protocols were in place, including screening questions prior to the visit, additional usage of staff PPE, and extensive cleaning of exam room while observing appropriate contact time as indicated for disinfecting solutions.  Notice: This dictation was prepared with Dragon dictation along with smaller phrase technology. Any transcriptional errors that result from this process are unintentional and may not be corrected upon review.  Patient Instructions / Medication Changes & Studies & Tests Ordered   Patient Instructions  Medication Instructions:  Decrease  Morning dose to 3.125 mg ( 1/2 tablet of 6.25 mg)  , continue with 6.25 mg in the evening. No other changes  *If you need a refill on your cardiac medications before your next appointment, please call your pharmacy*   Lab Work: Lipid- FASTING  CMP IN FEB  2022   If you have labs (blood work) drawn today and your tests are completely normal, you will receive your results only by:  MyChart Message (if you have MyChart) OR  A paper copy in the mail If you have any lab test that  is abnormal or we need to change your treatment, we will call you to review the results.   Testing/Procedures: Not needed   Follow-Up: At [facility], you and your health needs are our priority.  As part of our continuing mission to provide you with exceptional heart care, we have created designated Provider Care Teams.  These Care Teams include your primary Cardiologist (physician) and Advanced Practice Providers (APPs -  Physician Assistants and Nurse Practitioners) who all  work together to provide you with the care you need, when you need it.     Your next appointment:   3 month(s)  The format for your next appointment:   In Person  Provider:   Glenetta Hew, MD   Other Instructions    Studies Ordered:   Orders Placed This Encounter  Procedures   Lipid panel   Comprehensive metabolic panel   EKG [id number]     Glenetta Hew, M.D., M.S. Interventional Cardiologist   Pager # [phone] Phone # [phone] [address] [place], [place] [address]   Thank you for choosing Heartcare at [facility]!!

## 2020-11-12 NOTE — Assessment & Plan Note (Signed)
Remains on high-dose high intensity statin.  Due for lipid check in February.  Order lipids and chemistry for February, may adjust atorvastatin dosing at that time.  She asked about supplementation, I did recommend taking co-Q10 up to 300 mg daily.

## 2020-11-12 NOTE — Assessment & Plan Note (Signed)
DES PCI of RI in November 2021 in setting of lateral STEMI.  No angina, but is having some dyspnea.  Plan:  Continue carvedilol, but reduce morning dose to 1/2 tablet (3.25 mg) daily with p.m. dose remain the full 6.25 mg.  Continue high-dose atorvastatin for now-may adjust dose based on lipid profile.  Plan to start cardiac rehab this week  Continue aspirin and Brilinta for DAPT:

## 2020-11-12 NOTE — Assessment & Plan Note (Addendum)
Doing relatively well 6 weeks post STEMI.  Normal EF by echo with no or WMA.  Still hyper aware of any symptoms in her chest, but not really having any anginal symptoms.  Some exertional dyspnea, but no PND, orthopnea.  Was started on carvedilol in the hospital which may be contributing to some of her dyspnea.  Will reduce morning dose.  Otherwise continue high-dose high intensity statin and DAPT.  Is due to start cardiac rehab this week.  Cleared for full activity including horseback riding (needs to use helmet to protect her head) and extremely careful to avoid falls and bleeding/bruising.

## 2020-11-12 NOTE — Patient Instructions (Addendum)
Medication Instructions:  Decrease  Morning dose to 3.125 mg ( 1/2 tablet of 6.25 mg)  , continue with 6.25 mg in the evening. No other changes  *If you need a refill on your cardiac medications before your next appointment, please call your pharmacy*   Lab Work: Lipid- FASTING  CMP IN FEB  2022   If you have labs (blood work) drawn today and your tests are completely normal, you will receive your results only by: Marland Kitchen MyChart Message (if you have MyChart) OR . A paper copy in the mail If you have any lab test that is abnormal or we need to change your treatment, we will call you to review the results.   Testing/Procedures: Not needed   Follow-Up: At [facility], you and your health needs are our priority.  As part of our continuing mission to provide you with exceptional heart care, we have created designated Provider Care Teams.  These Care Teams include your primary Cardiologist (physician) and Advanced Practice Providers (APPs -  Physician Assistants and Nurse Practitioners) who all work together to provide you with the care you need, when you need it.     Your next appointment:   3 month(s)  The format for your next appointment:   In Person  Provider:   Bryan Lemma, MD   Other Instructions

## 2020-11-13 DIAGNOSIS — I2129 ST elevation (STEMI) myocardial infarction involving other sites: Secondary | ICD-10-CM | POA: Diagnosis not present

## 2020-11-13 NOTE — Telephone Encounter (Signed)
This is Dr. Harding's pt. °

## 2020-11-14 DIAGNOSIS — I2129 ST elevation (STEMI) myocardial infarction involving other sites: Secondary | ICD-10-CM | POA: Diagnosis not present

## 2020-11-15 DIAGNOSIS — I2129 ST elevation (STEMI) myocardial infarction involving other sites: Secondary | ICD-10-CM | POA: Diagnosis not present

## 2020-11-19 DIAGNOSIS — I2129 ST elevation (STEMI) myocardial infarction involving other sites: Secondary | ICD-10-CM | POA: Diagnosis not present

## 2020-11-21 DIAGNOSIS — I2129 ST elevation (STEMI) myocardial infarction involving other sites: Secondary | ICD-10-CM | POA: Diagnosis not present

## 2020-11-22 DIAGNOSIS — I2129 ST elevation (STEMI) myocardial infarction involving other sites: Secondary | ICD-10-CM | POA: Diagnosis not present

## 2020-12-03 ENCOUNTER — Other Ambulatory Visit: Payer: Self-pay | Admitting: Cardiology

## 2020-12-03 DIAGNOSIS — I2129 ST elevation (STEMI) myocardial infarction involving other sites: Secondary | ICD-10-CM | POA: Diagnosis not present

## 2020-12-04 MED ORDER — CARVEDILOL 6.25 MG PO TABS
ORAL_TABLET | ORAL | 3 refills | Status: DC
Start: 2020-12-04 — End: 2021-02-18

## 2020-12-05 DIAGNOSIS — I2129 ST elevation (STEMI) myocardial infarction involving other sites: Secondary | ICD-10-CM | POA: Diagnosis not present

## 2020-12-06 DIAGNOSIS — I2129 ST elevation (STEMI) myocardial infarction involving other sites: Secondary | ICD-10-CM | POA: Diagnosis not present

## 2020-12-10 DIAGNOSIS — I2129 ST elevation (STEMI) myocardial infarction involving other sites: Secondary | ICD-10-CM | POA: Diagnosis not present

## 2020-12-12 DIAGNOSIS — I2129 ST elevation (STEMI) myocardial infarction involving other sites: Secondary | ICD-10-CM | POA: Diagnosis not present

## 2020-12-12 DIAGNOSIS — Z5189 Encounter for other specified aftercare: Secondary | ICD-10-CM | POA: Diagnosis not present

## 2020-12-12 DIAGNOSIS — Z8674 Personal history of sudden cardiac arrest: Secondary | ICD-10-CM | POA: Diagnosis not present

## 2020-12-13 DIAGNOSIS — I2129 ST elevation (STEMI) myocardial infarction involving other sites: Secondary | ICD-10-CM | POA: Diagnosis not present

## 2020-12-13 DIAGNOSIS — Z5189 Encounter for other specified aftercare: Secondary | ICD-10-CM | POA: Diagnosis not present

## 2020-12-13 DIAGNOSIS — Z8674 Personal history of sudden cardiac arrest: Secondary | ICD-10-CM | POA: Diagnosis not present

## 2020-12-14 DIAGNOSIS — E785 Hyperlipidemia, unspecified: Secondary | ICD-10-CM | POA: Diagnosis not present

## 2020-12-14 DIAGNOSIS — I2510 Atherosclerotic heart disease of native coronary artery without angina pectoris: Secondary | ICD-10-CM | POA: Diagnosis not present

## 2020-12-14 DIAGNOSIS — I2129 ST elevation (STEMI) myocardial infarction involving other sites: Secondary | ICD-10-CM | POA: Diagnosis not present

## 2020-12-14 DIAGNOSIS — Z9861 Coronary angioplasty status: Secondary | ICD-10-CM | POA: Diagnosis not present

## 2020-12-14 LAB — COMPREHENSIVE METABOLIC PANEL
ALT: 21 IU/L (ref 0–32)
AST: 18 IU/L (ref 0–40)
Albumin/Globulin Ratio: 1.7 (ref 1.2–2.2)
Albumin: 4 g/dL (ref 3.8–4.8)
Alkaline Phosphatase: 92 IU/L (ref 44–121)
BUN/Creatinine Ratio: 10 (ref 9–23)
BUN: 8 mg/dL (ref 6–24)
Bilirubin Total: 0.6 mg/dL (ref 0.0–1.2)
CO2: 23 mmol/L (ref 20–29)
Calcium: 9.4 mg/dL (ref 8.7–10.2)
Chloride: 104 mmol/L (ref 96–106)
Creatinine, Ser: 0.81 mg/dL (ref 0.57–1.00)
GFR calc Af Amer: 101 mL/min/{1.73_m2} (ref >59)
GFR calc non Af Amer: 87 mL/min/{1.73_m2} (ref >59)
Globulin, Total: 2.4 g/dL (ref 1.5–4.5)
Glucose: 98 mg/dL (ref 65–99)
Potassium: 3.9 mmol/L (ref 3.5–5.2)
Sodium: 140 mmol/L (ref 134–144)
Total Protein: 6.4 g/dL (ref 6.0–8.5)

## 2020-12-14 LAB — LIPID PANEL
Chol/HDL Ratio: 2.1 ratio (ref 0.0–4.4)
Cholesterol, Total: 118 mg/dL (ref 100–199)
HDL: 56 mg/dL (ref >39)
LDL Chol Calc (NIH): 50 mg/dL (ref 0–99)
Triglycerides: 55 mg/dL (ref 0–149)
VLDL Cholesterol Cal: 12 mg/dL (ref 5–40)

## 2020-12-17 DIAGNOSIS — Z5189 Encounter for other specified aftercare: Secondary | ICD-10-CM | POA: Diagnosis not present

## 2020-12-17 DIAGNOSIS — I2129 ST elevation (STEMI) myocardial infarction involving other sites: Secondary | ICD-10-CM | POA: Diagnosis not present

## 2020-12-17 DIAGNOSIS — Z8674 Personal history of sudden cardiac arrest: Secondary | ICD-10-CM | POA: Diagnosis not present

## 2020-12-19 DIAGNOSIS — I2129 ST elevation (STEMI) myocardial infarction involving other sites: Secondary | ICD-10-CM | POA: Diagnosis not present

## 2020-12-19 DIAGNOSIS — Z5189 Encounter for other specified aftercare: Secondary | ICD-10-CM | POA: Diagnosis not present

## 2020-12-19 DIAGNOSIS — Z8674 Personal history of sudden cardiac arrest: Secondary | ICD-10-CM | POA: Diagnosis not present

## 2020-12-20 DIAGNOSIS — I2129 ST elevation (STEMI) myocardial infarction involving other sites: Secondary | ICD-10-CM | POA: Diagnosis not present

## 2020-12-20 DIAGNOSIS — Z8674 Personal history of sudden cardiac arrest: Secondary | ICD-10-CM | POA: Diagnosis not present

## 2020-12-20 DIAGNOSIS — Z5189 Encounter for other specified aftercare: Secondary | ICD-10-CM | POA: Diagnosis not present

## 2020-12-24 DIAGNOSIS — Z5189 Encounter for other specified aftercare: Secondary | ICD-10-CM | POA: Diagnosis not present

## 2020-12-24 DIAGNOSIS — Z8674 Personal history of sudden cardiac arrest: Secondary | ICD-10-CM | POA: Diagnosis not present

## 2020-12-24 DIAGNOSIS — I2129 ST elevation (STEMI) myocardial infarction involving other sites: Secondary | ICD-10-CM | POA: Diagnosis not present

## 2020-12-26 DIAGNOSIS — Z8674 Personal history of sudden cardiac arrest: Secondary | ICD-10-CM | POA: Diagnosis not present

## 2020-12-26 DIAGNOSIS — I2129 ST elevation (STEMI) myocardial infarction involving other sites: Secondary | ICD-10-CM | POA: Diagnosis not present

## 2020-12-26 DIAGNOSIS — Z5189 Encounter for other specified aftercare: Secondary | ICD-10-CM | POA: Diagnosis not present

## 2020-12-27 ENCOUNTER — Telehealth: Payer: Self-pay | Admitting: *Deleted

## 2020-12-27 DIAGNOSIS — Z8674 Personal history of sudden cardiac arrest: Secondary | ICD-10-CM | POA: Diagnosis not present

## 2020-12-27 DIAGNOSIS — Z5189 Encounter for other specified aftercare: Secondary | ICD-10-CM | POA: Diagnosis not present

## 2020-12-27 DIAGNOSIS — E785 Hyperlipidemia, unspecified: Secondary | ICD-10-CM

## 2020-12-27 DIAGNOSIS — I2129 ST elevation (STEMI) myocardial infarction involving other sites: Secondary | ICD-10-CM | POA: Diagnosis not present

## 2020-12-27 DIAGNOSIS — I2510 Atherosclerotic heart disease of native coronary artery without angina pectoris: Secondary | ICD-10-CM

## 2020-12-27 DIAGNOSIS — Z9861 Coronary angioplasty status: Secondary | ICD-10-CM

## 2020-12-27 MED ORDER — ATORVASTATIN CALCIUM 80 MG PO TABS
40.0000 mg | ORAL_TABLET | Freq: Every day | ORAL | 0 refills | Status: DC
Start: 2020-12-27 — End: 2021-03-11

## 2020-12-27 NOTE — Telephone Encounter (Signed)
Called left message - detail results about lab results. Information also released to mychart.  Mailed labslip-  any question may call back ?  Medication list changed

## 2020-12-27 NOTE — Telephone Encounter (Signed)
-----   Message from Marykay Lex, MD sent at 12/26/2020 10:21 PM EST ----- Cholesterol levels look great.  LDL is 50.  Toposar 118.  This is great!! -->  I think we can cut the atorvastatin dose in half (which would be 40 mg) continue to take this until we see in follow-up and can adjust atorvastatin. -> Recheck labs in the 4 months.  Chemistry panel is also look good.  Normal kidney/liver function and normal electrolytes.  Bryan Lemma, MD

## 2020-12-31 DIAGNOSIS — Z5189 Encounter for other specified aftercare: Secondary | ICD-10-CM | POA: Diagnosis not present

## 2020-12-31 DIAGNOSIS — Z8674 Personal history of sudden cardiac arrest: Secondary | ICD-10-CM | POA: Diagnosis not present

## 2020-12-31 DIAGNOSIS — I2129 ST elevation (STEMI) myocardial infarction involving other sites: Secondary | ICD-10-CM | POA: Diagnosis not present

## 2021-01-03 DIAGNOSIS — Z8674 Personal history of sudden cardiac arrest: Secondary | ICD-10-CM | POA: Diagnosis not present

## 2021-01-03 DIAGNOSIS — I2129 ST elevation (STEMI) myocardial infarction involving other sites: Secondary | ICD-10-CM | POA: Diagnosis not present

## 2021-01-03 DIAGNOSIS — Z5189 Encounter for other specified aftercare: Secondary | ICD-10-CM | POA: Diagnosis not present

## 2021-01-07 DIAGNOSIS — I2129 ST elevation (STEMI) myocardial infarction involving other sites: Secondary | ICD-10-CM | POA: Diagnosis not present

## 2021-01-07 DIAGNOSIS — Z8674 Personal history of sudden cardiac arrest: Secondary | ICD-10-CM | POA: Diagnosis not present

## 2021-01-07 DIAGNOSIS — Z5189 Encounter for other specified aftercare: Secondary | ICD-10-CM | POA: Diagnosis not present

## 2021-01-09 DIAGNOSIS — Z5189 Encounter for other specified aftercare: Secondary | ICD-10-CM | POA: Diagnosis not present

## 2021-01-09 DIAGNOSIS — I2129 ST elevation (STEMI) myocardial infarction involving other sites: Secondary | ICD-10-CM | POA: Diagnosis not present

## 2021-01-09 DIAGNOSIS — Z8674 Personal history of sudden cardiac arrest: Secondary | ICD-10-CM | POA: Diagnosis not present

## 2021-01-10 DIAGNOSIS — I2129 ST elevation (STEMI) myocardial infarction involving other sites: Secondary | ICD-10-CM | POA: Diagnosis not present

## 2021-01-10 DIAGNOSIS — Z8674 Personal history of sudden cardiac arrest: Secondary | ICD-10-CM | POA: Diagnosis not present

## 2021-01-10 DIAGNOSIS — Z5189 Encounter for other specified aftercare: Secondary | ICD-10-CM | POA: Diagnosis not present

## 2021-01-14 DIAGNOSIS — Z8674 Personal history of sudden cardiac arrest: Secondary | ICD-10-CM | POA: Diagnosis not present

## 2021-01-14 DIAGNOSIS — Z5189 Encounter for other specified aftercare: Secondary | ICD-10-CM | POA: Diagnosis not present

## 2021-01-14 DIAGNOSIS — I2129 ST elevation (STEMI) myocardial infarction involving other sites: Secondary | ICD-10-CM | POA: Diagnosis not present

## 2021-01-16 DIAGNOSIS — Z5189 Encounter for other specified aftercare: Secondary | ICD-10-CM | POA: Diagnosis not present

## 2021-01-16 DIAGNOSIS — Z8674 Personal history of sudden cardiac arrest: Secondary | ICD-10-CM | POA: Diagnosis not present

## 2021-01-16 DIAGNOSIS — I2129 ST elevation (STEMI) myocardial infarction involving other sites: Secondary | ICD-10-CM | POA: Diagnosis not present

## 2021-01-17 DIAGNOSIS — I2129 ST elevation (STEMI) myocardial infarction involving other sites: Secondary | ICD-10-CM | POA: Diagnosis not present

## 2021-01-17 DIAGNOSIS — Z8674 Personal history of sudden cardiac arrest: Secondary | ICD-10-CM | POA: Diagnosis not present

## 2021-01-17 DIAGNOSIS — Z5189 Encounter for other specified aftercare: Secondary | ICD-10-CM | POA: Diagnosis not present

## 2021-01-21 DIAGNOSIS — Z5189 Encounter for other specified aftercare: Secondary | ICD-10-CM | POA: Diagnosis not present

## 2021-01-21 DIAGNOSIS — I2129 ST elevation (STEMI) myocardial infarction involving other sites: Secondary | ICD-10-CM | POA: Diagnosis not present

## 2021-01-21 DIAGNOSIS — Z8674 Personal history of sudden cardiac arrest: Secondary | ICD-10-CM | POA: Diagnosis not present

## 2021-01-22 NOTE — Telephone Encounter (Signed)
MESSAGE SENT TO PATIENT .  Ms Menees,  Dr Herbie Baltimore does not usually do write  Medical release for Jury Duty, but incase he will do this  What is the information need to go on letter . Address , jury number , and when do you need to have the letter

## 2021-01-23 DIAGNOSIS — Z5189 Encounter for other specified aftercare: Secondary | ICD-10-CM | POA: Diagnosis not present

## 2021-01-23 DIAGNOSIS — I2129 ST elevation (STEMI) myocardial infarction involving other sites: Secondary | ICD-10-CM | POA: Diagnosis not present

## 2021-01-23 DIAGNOSIS — Z8674 Personal history of sudden cardiac arrest: Secondary | ICD-10-CM | POA: Diagnosis not present

## 2021-01-28 DIAGNOSIS — I2129 ST elevation (STEMI) myocardial infarction involving other sites: Secondary | ICD-10-CM | POA: Diagnosis not present

## 2021-01-28 DIAGNOSIS — Z5189 Encounter for other specified aftercare: Secondary | ICD-10-CM | POA: Diagnosis not present

## 2021-01-28 DIAGNOSIS — Z8674 Personal history of sudden cardiac arrest: Secondary | ICD-10-CM | POA: Diagnosis not present

## 2021-01-30 DIAGNOSIS — I2129 ST elevation (STEMI) myocardial infarction involving other sites: Secondary | ICD-10-CM | POA: Diagnosis not present

## 2021-01-30 DIAGNOSIS — Z5189 Encounter for other specified aftercare: Secondary | ICD-10-CM | POA: Diagnosis not present

## 2021-01-30 DIAGNOSIS — Z8674 Personal history of sudden cardiac arrest: Secondary | ICD-10-CM | POA: Diagnosis not present

## 2021-01-31 DIAGNOSIS — I2129 ST elevation (STEMI) myocardial infarction involving other sites: Secondary | ICD-10-CM | POA: Diagnosis not present

## 2021-01-31 DIAGNOSIS — Z5189 Encounter for other specified aftercare: Secondary | ICD-10-CM | POA: Diagnosis not present

## 2021-01-31 DIAGNOSIS — Z8674 Personal history of sudden cardiac arrest: Secondary | ICD-10-CM | POA: Diagnosis not present

## 2021-02-04 DIAGNOSIS — Z5189 Encounter for other specified aftercare: Secondary | ICD-10-CM | POA: Diagnosis not present

## 2021-02-04 DIAGNOSIS — Z8674 Personal history of sudden cardiac arrest: Secondary | ICD-10-CM | POA: Diagnosis not present

## 2021-02-04 DIAGNOSIS — I2129 ST elevation (STEMI) myocardial infarction involving other sites: Secondary | ICD-10-CM | POA: Diagnosis not present

## 2021-02-06 DIAGNOSIS — Z8674 Personal history of sudden cardiac arrest: Secondary | ICD-10-CM | POA: Diagnosis not present

## 2021-02-06 DIAGNOSIS — Z5189 Encounter for other specified aftercare: Secondary | ICD-10-CM | POA: Diagnosis not present

## 2021-02-06 DIAGNOSIS — I2129 ST elevation (STEMI) myocardial infarction involving other sites: Secondary | ICD-10-CM | POA: Diagnosis not present

## 2021-02-07 DIAGNOSIS — Z8674 Personal history of sudden cardiac arrest: Secondary | ICD-10-CM | POA: Diagnosis not present

## 2021-02-07 DIAGNOSIS — I2129 ST elevation (STEMI) myocardial infarction involving other sites: Secondary | ICD-10-CM | POA: Diagnosis not present

## 2021-02-07 DIAGNOSIS — Z5189 Encounter for other specified aftercare: Secondary | ICD-10-CM | POA: Diagnosis not present

## 2021-02-11 DIAGNOSIS — Z5189 Encounter for other specified aftercare: Secondary | ICD-10-CM | POA: Diagnosis not present

## 2021-02-11 DIAGNOSIS — I2129 ST elevation (STEMI) myocardial infarction involving other sites: Secondary | ICD-10-CM | POA: Diagnosis not present

## 2021-02-11 DIAGNOSIS — Z8674 Personal history of sudden cardiac arrest: Secondary | ICD-10-CM | POA: Diagnosis not present

## 2021-02-13 DIAGNOSIS — Z8674 Personal history of sudden cardiac arrest: Secondary | ICD-10-CM | POA: Diagnosis not present

## 2021-02-13 DIAGNOSIS — Z5189 Encounter for other specified aftercare: Secondary | ICD-10-CM | POA: Diagnosis not present

## 2021-02-13 DIAGNOSIS — I2129 ST elevation (STEMI) myocardial infarction involving other sites: Secondary | ICD-10-CM | POA: Diagnosis not present

## 2021-02-18 ENCOUNTER — Ambulatory Visit: Payer: BC Managed Care – PPO | Admitting: Cardiology

## 2021-02-18 ENCOUNTER — Other Ambulatory Visit: Payer: Self-pay

## 2021-02-18 ENCOUNTER — Encounter: Payer: Self-pay | Admitting: Cardiology

## 2021-02-18 VITALS — BP 110/70 | HR 71 | Ht 62.0 in | Wt 180.0 lb

## 2021-02-18 DIAGNOSIS — E785 Hyperlipidemia, unspecified: Secondary | ICD-10-CM | POA: Diagnosis not present

## 2021-02-18 DIAGNOSIS — Z9861 Coronary angioplasty status: Secondary | ICD-10-CM

## 2021-02-18 DIAGNOSIS — I2510 Atherosclerotic heart disease of native coronary artery without angina pectoris: Secondary | ICD-10-CM

## 2021-02-18 DIAGNOSIS — I10 Essential (primary) hypertension: Secondary | ICD-10-CM | POA: Diagnosis not present

## 2021-02-18 MED ORDER — CARVEDILOL 3.125 MG PO TABS: 3.1250 mg | ORAL_TABLET | Freq: Two times a day (BID) | ORAL | 3 refills | Status: DC

## 2021-02-18 NOTE — Patient Instructions (Signed)
Medication Instructions:  Reduce carvedilol (Coreg) to 3.125mg  twice a day.  If excessive bruising occurs, you may hold aspirin for 2-3 days.   *If you need a refill on your cardiac medications before your next appointment, please call your pharmacy*   Lab Work: Labs to be done in June   If you have labs (blood work) drawn today and your tests are completely normal, you will receive your results only by: Marland Kitchen MyChart Message (if you have MyChart) OR . A paper copy in the mail If you have any lab test that is abnormal or we need to change your treatment, we will call you to review the results.   Testing/Procedures: None ordered   Follow-Up: At [facility], you and your health needs are our priority.  As part of our continuing mission to provide you with exceptional heart care, we have created designated Provider Care Teams.  These Care Teams include your primary Cardiologist (physician) and Advanced Practice Providers (APPs -  Physician Assistants and Nurse Practitioners) who all work together to provide you with the care you need, when you need it.   Your next appointment:   7 month(s)  The format for your next appointment:   In Person  Provider:   Bryan Lemma, MD

## 2021-02-18 NOTE — Progress Notes (Signed)
Primary Care Provider: Ursula Beath, MD Cardiologist: Bryan Lemma, MD Electrophysiologist: None  Clinic Note: Chief Complaint  Patient presents with  . Follow-up    3 months.  . Shortness of Breath    Stable.  Improved.  . Coronary Artery Disease    No angina    ASSESSMENT/PLAN   Problem List Items Addressed This Visit    Hyperlipidemia with target LDL less than 70 (Chronic)    This is a great.  We can recheck in June, if stable.   If her energy level is still down, we will probably convert to Crestor.      Relevant Medications   carvedilol (COREG) 3.125 MG tablet   Essential hypertension (Chronic)    Blood pressure looks great on current meds.   With a still having some fatigue, we will simply reduce her carvedilol to 3.125 mg twice daily.      Relevant Medications   carvedilol (COREG) 3.125 MG tablet   CAD S/P PCI of 100% ost-prox RI - Primary (Chronic)    Just 5 months out from bilateral STEMI with PCI to the RI.  No other significant disease. No further angina but still having some mild exertional dyspnea.  Energy improving with reduced dose of carvedilol the morning.  Plan:   Reduce dosing of carvedilol 3.125 mg twice daily  Continue DAPT with aspirin plus Brilinta until the end of November 2022.  If she still has these weird shortness of breath episodes, may need to switch her to Plavix.  If she has significant bleeding or bruising, okay to hold aspirin for 2 to 3 days.  Continue moderate dose atorvastatin for now.      Relevant Medications   carvedilol (COREG) 3.125 MG tablet     -------------------------------------  HPI:    Renee Fitzgerald is a 45 y.o. female with a PMH notable for recent Lateral STEMI (DES PCI of the ostial RI) in November 2021 along with HTN and HLD who presents today for 3 month follow-up.  Renee Fitzgerald was last seen on November 12, 2020 for her second postop follow-up.  Back in December she had felt very tired  and weak with no energy level.  Was having headaches on Imdur.  Having difficulty sleeping and shortness of breath with Brilinta. => When I saw her, she was doing well.  No further angina.  Just "hyper aware of things going on in her chest.  Was ready to start cardiac rehab.  Had not yet done exercise.  No recurrent angina or CHF symptoms.  Recent Hospitalizations: None  Reviewed  CV studies:    The following studies were reviewed today: (if available, images/films reviewed: From Epic Chart or Care Everywhere)  None  Interval History:   Renee Fitzgerald returns for follow-up today stating that she is doing pretty well.  She has graduated from cardiac rehab and is now joining a gym.  She says her energy level is definitely better but she still may get more tired if she overdoes it.  But she is not as weak as she was.  Strength is definitely improving.  She is back to riding horses but is being very very careful. No further cardiac symptoms besides maybe some mild fatigue.  A little bit of exertional dyspnea more related to deconditioning.  CV Review of Symptoms (Summary): positive for - dyspnea on exertion and Mild fatigue, and fear/anxiety negative for - chest pain, edema, irregular heartbeat, orthopnea, palpitations, paroxysmal nocturnal dyspnea, rapid  heart rate, shortness of breath or Lightheadedness, dizziness or syncope/near syncope, TIA/amaurosis fugax, melena, hematochezia, hematuria epistaxis, myalgias.  The patient does not have symptoms concerning for COVID-19 infection (fever, chills, cough, or new shortness of breath).   REVIEWED OF SYSTEMS   Review of Systems  Constitutional: Positive for weight loss (Maybe little bit). Negative for malaise/fatigue (Still feels tired).  HENT: Negative for congestion and nosebleeds.   Respiratory: Positive for shortness of breath (If she overdoes it with exertion). Negative for cough.   Cardiovascular: Negative for leg swelling.   Gastrointestinal: Negative for abdominal pain, blood in stool and melena.  Genitourinary: Negative for hematuria.  Musculoskeletal: Negative for joint pain.  Neurological: Negative for dizziness (No longer having dizziness, but initially did have some), weakness (Almost all the way back to full strength) and headaches.  Psychiatric/Behavioral: Negative for memory loss. The patient is nervous/anxious (Gradually becoming less anxious.). The patient does not have insomnia.     I have reviewed and (if needed) personally updated the patient's problem list, medications, allergies, past medical and surgical history, social and family history.   PAST MEDICAL HISTORY   Past Medical History:  Diagnosis Date  . Anemia   . CAD S/P PCI of 100% ost-prox RI 09/29/2020   Lateral STEMI -> 100% ost-prox Diag - Resolute Onyx DES 2.0 x 15 mm - 2.3 mm  . Essential hypertension 10/01/2020  . Hyperlipidemia with target LDL less than 70 10/01/2020  . Migraine   . ST elevation myocardial infarction (STEMI) of lateral wall (HCC) 09/29/2020   RI 100% - DES PCI.     PAST SURGICAL HISTORY   Past Surgical History:  Procedure Laterality Date  . CORONARY STENT INTERVENTION N/A 09/29/2020   Procedure: CORONARY STENT INTERVENTION;  Surgeon: Marykay LexHarding, Reason Helzer W, MD;  [facility];  Service: Cardiovascular:  Culprit Lesion -> ostial RI 100% (DES PCI-Resolute Onyx 2.0 mm x 15 mm--2.3 mm).  Rosalie Doctor. CORONARY/GRAFT ACUTE MI REVASCULARIZATION N/A 09/29/2020   Procedure: Coronary/Graft Acute MI Revascularization;  Surgeon: Marykay LexHarding, Ayde Record W, MD;  [facility];  Service: Cardiovascular;: Insetting of lateral STEMI: PTCA-DES PCI of culprit lesion-ostial RI 100% thrombotic occlusion  . LEFT HEART CATH AND CORONARY ANGIOGRAPHY N/A 09/29/2020   Procedure: LEFT HEART CATH AND CORONARY ANGIOGRAPHY;  Surgeon: Marykay LexHarding, Karlos Scadden W, MD;  [facility];  Service: Cardiovascular: (LATERAL STEMI) \:  Culprit Lesion -> ostial RI 100% (DES PCI).  Distal LAD 30% RCA and LCx.  EF 55 to 60% anterolateral HK.  Marland Kitchen. TRANSTHORACIC ECHOCARDIOGRAM  09/30/2020   Lateral STEMI:  EF 60-65%.  No R WMA.  Normal diastolic pressures.  Normal valves.  . WISDOM TOOTH EXTRACTION     . Cardiac Cath-PCI 09/29/2020: Culprit Lesion -> ostial RI 100% (DES PCI-B . esolute Onyx 2.0 mm x 15 mm--2.3 mm).  Distal LAD 30% RCA and LCx.  EF 55 to 60% anterolateral HK.       There is no immunization history on file for this patient.  MEDICATIONS/ALLERGIES   Current Meds  Medication Sig  . ALPRAZolam (XANAX) 0.25 MG tablet Take 0.25 mg by mouth daily as needed for anxiety.   Marland Kitchen. aspirin 81 MG chewable tablet Chew 1 tablet (81 mg total) by mouth daily.  Marland Kitchen. atorvastatin (LIPITOR) 80 MG tablet Take 0.5 tablets (40 mg total) by mouth daily.  . carvedilol (COREG) 3.125 MG tablet Take 1 tablet (3.125 mg total) by mouth 2 (two) times daily with a meal.  .  Melatonin 10 MG TABS Take 10 mg by mouth at bedtime.  . Multiple Vitamin (MULTIVITAMIN WITH MINERALS) TABS tablet Take 1 tablet by mouth once a week.  . nitroGLYCERIN (NITROSTAT) 0.4 MG SL tablet Place 1 tablet (0.4 mg total) under the tongue every 5 (five) minutes as needed.  Marland Kitchen omeprazole (PRILOSEC) 20 MG capsule Take 20 mg by mouth daily as needed (acid reflux).   . pantoprazole (PROTONIX) 40 MG tablet Take by mouth.  . promethazine (PHENERGAN) 25 MG tablet Take 25 mg by mouth daily as needed for nausea or vomiting.   . ticagrelor (BRILINTA) 90 MG TABS tablet Take 1 tablet (90 mg total) by mouth 2 (two) times daily.  Marland Kitchen Ubrogepant (UBRELVY) 100 MG TABS Take by mouth.  . zolpidem (AMBIEN) 10 MG tablet Take 10 mg by mouth at bedtime as needed for sleep.  . [DISCONTINUED] carvedilol (COREG) 6.25 MG tablet TAKE 3.125 MG (1/2 TABLET OF 6.25 MG) IN THE MORNING, 6.25 MG IN THE EVENING    Allergies  Allergen Reactions  . Penicillins Rash    SOCIAL HISTORY/FAMILY HISTORY    Reviewed in Epic:  Pertinent findings:  Social History   Tobacco Use  . Smoking status: Never Smoker  . Smokeless tobacco: Never Used  Substance Use Topics  . Alcohol use: No  . Drug use: No   Social History   Social History Narrative  . Not on file    OBJCTIVE -PE, EKG, labs   Wt Readings from Last 3 Encounters:  02/18/21 180 lb (81.6 kg)  11/12/20 181 lb 9.6 oz (82.4 kg)  10/10/20 182 lb 12.8 oz (82.9 kg)    Physical Exam: BP 110/70 (BP Location: Left Arm, Patient Position: Sitting, Cuff Size: Large)   Pulse 71   Ht 5\' 2"  (1.575 m)   Wt 180 lb (81.6 kg)   BMI 32.92 kg/m  Physical Exam Constitutional:      General: She is not in acute distress.    Appearance: Normal appearance. She is obese. She is not ill-appearing (Healthy-appearing.  Well-groomed.) or toxic-appearing.     Comments: Healthy-appearing.  Well-groomed.  HENT:     Head: Normocephalic and atraumatic.  Neck:     Vascular: No carotid bruit, hepatojugular reflux or JVD.  Cardiovascular:     Rate and Rhythm: Normal rate and regular rhythm.  No extrasystoles are present.    Chest Wall: PMI is not displaced.     Pulses: Normal pulses.     Heart sounds: Normal heart sounds. No murmur heard. No friction rub. No gallop.      Comments: Radial cath site clean dry intact.  No ecchymosis or bruising.  Positive Allen's test Pulmonary:     Effort: Pulmonary effort is normal.     Breath sounds: Normal breath sounds.  Chest:     Chest wall: No tenderness.  Musculoskeletal:        General: No swelling. Normal range of motion.     Cervical back: Normal range of motion and neck supple.  Skin:    General: Skin is warm and dry.  Neurological:     General: No focal deficit present.     Mental Status: She is alert and oriented to person, place, and time. Mental status is at baseline.  Psychiatric:        Mood and Affect: Mood normal.        Behavior: Behavior normal.        Thought Content: Thought content  normal.  Judgment: Judgment normal.     Comments: Notably anxious and timid, but is getting more confident.     Adult ECG Report N/A  Recent Labs: Reviewed Lab Results  Component Value Date   CHOL 118 12/14/2020   HDL 56 12/14/2020   LDLCALC 50 12/14/2020   TRIG 55 12/14/2020   CHOLHDL 2.1 12/14/2020   Lab Results  Component Value Date   CREATININE 0.81 12/14/2020   BUN 8 12/14/2020   NA 140 12/14/2020   K 3.9 12/14/2020   CL 104 12/14/2020   CO2 23 12/14/2020   CBC Latest Ref Rng & Units 10/10/2020 10/05/2020 09/30/2020  WBC 3.4 - 10.8 x10E3/uL 7.2 6.0 10.0  Hemoglobin 11.1 - 15.9 g/dL 63.1 49.7 02.6  Hematocrit 34.0 - 46.6 % 33.9(L) 36.4 39.6  Platelets 150 - 450 x10E3/uL 272 292 283    No results found for: TSH  -------------------------------------------------   COVID-19 Education: The signs and symptoms of COVID-19 were discussed with the patient and how to seek care for testing (follow up with PCP or arrange E-visit).   The patient is practicing social distancing & Masking.   I spent a total of 22 minutes with the patient spent in direct patient consultation.  Additional time spent with chart review  / charting (studies, outside notes, etc): 10 min Total Time: 32 min   Current medicines are reviewed at length with the patient today.  (+/- concerns) N/A  This visit occurred during the SARS-CoV-2 public health emergency.  Safety protocols were in place, including screening questions prior to the visit, additional usage of staff PPE, and extensive cleaning of exam room while observing appropriate contact time as indicated for disinfecting solutions.  Notice: This dictation was prepared with Dragon dictation along with smaller phrase technology. Any transcriptional errors that result from this process are unintentional and may not be corrected upon review.  Patient Instructions / Medication Changes & Studies & Tests Ordered   Patient Instructions   Medication Instructions:  Reduce carvedilol (Coreg) to 3.125mg  twice a day.  If excessive bruising occurs, you may hold aspirin for 2-3 days.   *If you need a refill on your cardiac medications before your next appointment, please call your pharmacy*   Lab Work: Labs to be done in June   If you have labs (blood work) drawn today and your tests are completely normal, you will receive your results only by: Marland Kitchen MyChart Message (if you have MyChart) OR . A paper copy in the mail If you have any lab test that is abnormal or we need to change your treatment, we will call you to review the results.   Testing/Procedures: None ordered   Follow-Up: At [facility], you and your health needs are our priority.  As part of our continuing mission to provide you with exceptional heart care, we have created designated Provider Care Teams.  These Care Teams include your primary Cardiologist (physician) and Advanced Practice Providers (APPs -  Physician Assistants and Nurse Practitioners) who all work together to provide you with the care you need, when you need it.   Your next appointment:   7 month(s)  The format for your next appointment:   In Person  Provider:   Bryan Lemma, MD       Studies Ordered:   No orders of the defined types were placed in this encounter.    Bryan Lemma, M.D., M.S. Interventional Cardiologist   Pager # [phone] Phone # [phone] [address]  [place], [place] [address]   Thank you for choosing Heartcare at [facility]!!

## 2021-03-03 ENCOUNTER — Encounter: Payer: Self-pay | Admitting: Cardiology

## 2021-03-03 NOTE — Assessment & Plan Note (Signed)
This is a great.  We can recheck in June, if stable.   If her energy level is still down, we will probably convert to Crestor.

## 2021-03-03 NOTE — Assessment & Plan Note (Addendum)
Just 5 months out from bilateral STEMI with PCI to the RI.  No other significant disease. No further angina but still having some mild exertional dyspnea.  Energy improving with reduced dose of carvedilol the morning.  Plan:   Reduce dosing of carvedilol 3.125 mg twice daily  Continue DAPT with aspirin plus Brilinta until the end of November 2022.  If she still has these weird shortness of breath episodes, may need to switch her to Plavix.  If she has significant bleeding or bruising, okay to hold aspirin for 2 to 3 days.  Continue moderate dose atorvastatin for now.

## 2021-03-03 NOTE — Assessment & Plan Note (Addendum)
Blood pressure looks great on current meds.   With a still having some fatigue, we will simply reduce her carvedilol to 3.125 mg twice daily.

## 2021-03-11 ENCOUNTER — Other Ambulatory Visit: Payer: Self-pay | Admitting: Cardiology

## 2021-03-26 DIAGNOSIS — Z880 Allergy status to penicillin: Secondary | ICD-10-CM | POA: Diagnosis not present

## 2021-03-26 DIAGNOSIS — I252 Old myocardial infarction: Secondary | ICD-10-CM | POA: Diagnosis not present

## 2021-03-26 DIAGNOSIS — Z955 Presence of coronary angioplasty implant and graft: Secondary | ICD-10-CM | POA: Diagnosis not present

## 2021-03-26 DIAGNOSIS — R112 Nausea with vomiting, unspecified: Secondary | ICD-10-CM | POA: Diagnosis not present

## 2021-03-26 DIAGNOSIS — R079 Chest pain, unspecified: Secondary | ICD-10-CM | POA: Diagnosis not present

## 2021-03-26 DIAGNOSIS — R0789 Other chest pain: Secondary | ICD-10-CM | POA: Diagnosis not present

## 2021-03-26 DIAGNOSIS — R072 Precordial pain: Secondary | ICD-10-CM | POA: Diagnosis not present

## 2021-03-26 DIAGNOSIS — R0602 Shortness of breath: Secondary | ICD-10-CM | POA: Diagnosis not present

## 2021-05-08 DIAGNOSIS — E785 Hyperlipidemia, unspecified: Secondary | ICD-10-CM | POA: Diagnosis not present

## 2021-05-08 DIAGNOSIS — Z9861 Coronary angioplasty status: Secondary | ICD-10-CM | POA: Diagnosis not present

## 2021-05-08 DIAGNOSIS — I2510 Atherosclerotic heart disease of native coronary artery without angina pectoris: Secondary | ICD-10-CM | POA: Diagnosis not present

## 2021-05-08 LAB — HEPATIC FUNCTION PANEL
ALT: 20 IU/L (ref 0–32)
AST: 14 IU/L (ref 0–40)
Albumin: 4.1 g/dL (ref 3.8–4.8)
Alkaline Phosphatase: 90 IU/L (ref 44–121)
Bilirubin Total: 0.7 mg/dL (ref 0.0–1.2)
Bilirubin, Direct: 0.19 mg/dL (ref 0.00–0.40)
Total Protein: 6.8 g/dL (ref 6.0–8.5)

## 2021-05-08 LAB — LIPID PANEL
Chol/HDL Ratio: 2.3 ratio (ref 0.0–4.4)
Cholesterol, Total: 136 mg/dL (ref 100–199)
HDL: 60 mg/dL (ref >39)
LDL Chol Calc (NIH): 62 mg/dL (ref 0–99)
Triglycerides: 67 mg/dL (ref 0–149)
VLDL Cholesterol Cal: 14 mg/dL (ref 5–40)

## 2021-05-28 ENCOUNTER — Other Ambulatory Visit: Payer: Self-pay | Admitting: Cardiology

## 2021-08-26 NOTE — Telephone Encounter (Signed)
No, she had labs done in June that look great.  DH

## 2021-08-27 NOTE — Telephone Encounter (Signed)
Sent message to patient she does not ned labs for Nov appointment.

## 2021-09-30 ENCOUNTER — Ambulatory Visit: Payer: BC Managed Care – PPO | Admitting: Student

## 2021-09-30 NOTE — Progress Notes (Addendum)
Cardiology Clinic Note   Patient Name: Renee Fitzgerald Date of Encounter: 10/01/2021  Primary Care Provider:  Esperanza Richters, MD Primary Cardiologist:  Glenetta Hew, MD  Patient Profile    Renee Fitzgerald 45 year old female presents the clinic today for follow-up evaluation of her coronary artery disease and essential hypertension.  Past Medical History    Past Medical History:  Diagnosis Date   Anemia    CAD S/P PCI of 100% ost-prox RI 09/29/2020   Lateral STEMI -> 100% ost-prox Diag - Resolute Onyx DES 2.0 x 15 mm - 2.3 mm   Essential hypertension 10/01/2020   Hyperlipidemia with target LDL less than 70 10/01/2020   Migraine    ST elevation myocardial infarction (STEMI) of lateral wall ([place]) 09/29/2020   RI 100% - DES PCI.    Past Surgical History:  Procedure Laterality Date   CORONARY STENT INTERVENTION N/A 09/29/2020   Procedure: CORONARY STENT INTERVENTION;  Surgeon: Leonie Man, MD;  Location: [facility];  Service: Cardiovascular:  Culprit Lesion -> ostial RI 100% (DES PCI-Resolute Onyx 2.0 mm x 15 mm--2.3 mm).   CORONARY/GRAFT ACUTE MI REVASCULARIZATION N/A 09/29/2020   Procedure: Coronary/Graft Acute MI Revascularization;  Surgeon: Leonie Man, MD;  Location: [place] [facility];  Service: Cardiovascular;: Insetting of lateral STEMI: PTCA-DES PCI of culprit lesion-ostial RI 100% thrombotic occlusion   LEFT HEART CATH AND CORONARY ANGIOGRAPHY N/A 09/29/2020   Procedure: LEFT HEART CATH AND CORONARY ANGIOGRAPHY;  Surgeon: Leonie Man, MD;  Location: [facility];  Service: Cardiovascular: (LATERAL STEMI) \: Culprit Lesion -> ostial RI 100% (DES PCI).  Distal LAD 30% RCA and LCx.  EF 55 to 60% anterolateral HK.   TRANSTHORACIC ECHOCARDIOGRAM  09/30/2020   Lateral STEMI:  EF 60-65%.  No R WMA.  Normal diastolic pressures.  Normal valves.   WISDOM TOOTH EXTRACTION      Allergies  Allergies  Allergen Reactions   Penicillins Rash     History of Present Illness    Renee Fitzgerald has a PMH of STEMI with PCI and DES of the ostial RI 11/21, hypertension, and hyperlipidemia.  She was seen 1/22 for follow-up status post STEMI.  She had been very tired, weak and had no energy.  She was having headaches related to Imdur medication.  She was also having difficulty sleeping and shortness of breath with Brilinta.  When she was seen by Dr. Ellyn Hack she was doing well.  She denied further episodes of angina.  She was hyperaware of chest discomfort.  She felt ready to start cardiac rehab.  She had not yet done an exercise.  She denied recurrent angina or heart failure type symptoms.  She presented for follow-up 02/18/21.  During that time she continued to do well.  She had graduated from cardiac rehab and was doing gym.  She felt her energy level had improved.  She did not feel as weak.  She had started riding horses again and was being very careful.  She denied anginal symptoms.  She did notice occasional dyspnea with exertion which was felt to be related to deconditioning.  She presents to the clinic today for follow-up evaluation states she feels well.  She continues to be physically active exercising on her treadmill or stationary bike 4-5 times per week.  She also continues to ride her horses.  She has a Loss adjuster, chartered and an [place].  We reviewed her angiography.  She is no longer taking Imdur.  She did have to take 1 dose of nitroglycerin in May for chest discomfort.  She presented to the emergency department and her pain was not felt to be related to her chest.  She has had no further episodes of chest discomfort and denies exertional chest pain.  We will continue her atorvastatin.  We reviewed her cholesterol panel and she expressed understanding.  I will reach out to Dr. Ellyn Hack to discuss aspirin and Brilinta dosing moving forward.  We will plan follow-up for 6 months and repeat her fasting lipids and LFTs in 6 months.  I will also  give her the high-fiber diet sheet.  Addendum to previous note.  Case discussed with Dr. Ellyn Hack.  I will change her Brilinta to 60 mg twice daily and stop her aspirin at this time.  Today she denies chest pain, shortness of breath, lower extremity edema, fatigue, palpitations, melena, hematuria, hemoptysis, diaphoresis, weakness, presyncope, syncope, orthopnea, and PND.   Home Medications    Prior to Admission medications   Medication Sig Start Date End Date Taking? Authorizing Provider  ALPRAZolam Duanne Moron) 0.25 MG tablet Take 0.25 mg by mouth daily as needed for anxiety.  06/11/20   [provider]  aspirin 81 MG chewable tablet Chew 1 tablet (81 mg total) by mouth daily. 10/02/20   Cheryln Manly, NP  atorvastatin (LIPITOR) 80 MG tablet Take 0.5 tablets (40 mg total) by mouth daily. 03/11/21   Leonie Man, MD  BRILINTA 90 MG TABS tablet TAKE 1 TABLET BY MOUTH 2 TIMES DAILY. 05/29/21   Leonie Man, MD  carvedilol (COREG) 3.125 MG tablet Take 1 tablet (3.125 mg total) by mouth 2 (two) times daily with a meal. 02/18/21   Leonie Man, MD  isosorbide mononitrate (IMDUR) 30 MG 24 hr tablet TAKE 1 TABLET BY MOUTH ONCE DAILY 10/05/20 10/05/21  Isla Pence, MD  Melatonin 10 MG TABS Take 10 mg by mouth at bedtime.    [provider]  Multiple Vitamin (MULTIVITAMIN WITH MINERALS) TABS tablet Take 1 tablet by mouth once a week.    [provider]  nitroGLYCERIN (NITROSTAT) 0.4 MG SL tablet Place 1 tablet (0.4 mg total) under the tongue every 5 (five) minutes as needed. 10/01/20   Cheryln Manly, NP  omeprazole (PRILOSEC) 20 MG capsule Take 20 mg by mouth daily as needed (acid reflux).  07/20/20   [provider]  pantoprazole (PROTONIX) 40 MG tablet Take by mouth.    [provider]  promethazine (PHENERGAN) 25 MG tablet Take 25 mg by mouth daily as needed for nausea or vomiting.  06/06/20   [provider]  Ubrogepant (UBRELVY)  100 MG TABS Take by mouth. 10/23/20   [provider]  zolpidem (AMBIEN) 10 MG tablet Take 10 mg by mouth at bedtime as needed for sleep. 09/10/20   [provider]    Family History    Family History  Problem Relation Age of Onset   Healthy Mother    Healthy Father    Heart attack Paternal Grandfather 77   CAD Paternal Grandfather 66   She indicated that her mother is alive. She indicated that her father is alive. She indicated that her paternal grandfather is alive.  Social History    Social History   Socioeconomic History   Marital status: Married    Spouse name: Not on file   Number of children: Not on file   Years of education: Not on file   Highest  education level: Not on file  Occupational History   Not on file  Tobacco Use   Smoking status: Never   Smokeless tobacco: Never  Substance and Sexual Activity   Alcohol use: No   Drug use: No   Sexual activity: Not on file  Other Topics Concern   Not on file  Social History Narrative   Not on file   Social Determinants of Health   Financial Resource Strain: Not on file  Food Insecurity: Not on file  Transportation Needs: Not on file  Physical Activity: Not on file  Stress: Not on file  Social Connections: Not on file  Intimate Partner Violence: Not on file     Review of Systems    General:  No chills, fever, night sweats or weight changes.  Cardiovascular:  No chest pain, dyspnea on exertion, edema, orthopnea, palpitations, paroxysmal nocturnal dyspnea. Dermatological: No rash, lesions/masses Respiratory: No cough, dyspnea Urologic: No hematuria, dysuria Abdominal:   No nausea, vomiting, diarrhea, bright red blood per rectum, melena, or hematemesis Neurologic:  No visual changes, wkns, changes in mental status. All other systems reviewed and are otherwise negative except as noted above.  Physical Exam    VS:  BP 112/76 (BP Location: Left Arm, Patient Position: Sitting)   Pulse 62    Resp 16   Ht 5\' 2"  (1.575 m)   Wt 168 lb (76.2 kg)   LMP 09/11/2021   SpO2 99%   BMI 30.73 kg/m  , BMI Body mass index is 30.73 kg/m. GEN: Well nourished, well developed, in no acute distress. HEENT: normal. Neck: Supple, no JVD, carotid bruits, or masses. Cardiac: RRR, no murmurs, rubs, or gallops. No clubbing, cyanosis, edema.  Radials/DP/PT 2+ and equal bilaterally.  Respiratory:  Respirations regular and unlabored, clear to auscultation bilaterally. GI: Soft, nontender, nondistended, BS + x 4. MS: no deformity or atrophy. Skin: warm and dry, no rash. Neuro:  Strength and sensation are intact. Psych: Normal affect.  Accessory Clinical Findings    Recent Labs: 10/10/2020: Hemoglobin 11.3; Platelets 272 12/14/2020: BUN 8; Creatinine, Ser 0.81; Potassium 3.9; Sodium 140 05/08/2021: ALT 20   Recent Lipid Panel    Component Value Date/Time   CHOL 136 05/08/2021 0816   TRIG 67 05/08/2021 0816   HDL 60 05/08/2021 0816   CHOLHDL 2.3 05/08/2021 0816   CHOLHDL 3.1 09/30/2020 0103   VLDL 18 09/30/2020 0103   LDLCALC 62 05/08/2021 0816    ECG personally reviewed by me today-sinus bradycardia 59 bpm- No acute changes  Echocardiogram 09/30/2020 IMPRESSIONS     1. Left ventricular ejection fraction, by estimation, is 60 to 65%. The  left ventricle has normal function. The left ventricle demonstrates  regional wall motion abnormalities (see scoring diagram/findings for  description). Left ventricular diastolic  parameters were normal.   2. Right ventricular systolic function is normal. The right ventricular  size is normal.   3. The mitral valve is normal in structure. No evidence of mitral valve  regurgitation. No evidence of mitral stenosis.   4. The aortic valve is normal in structure. Aortic valve regurgitation is  not visualized. No aortic stenosis is present.   5. The inferior vena cava is normal in size with greater than 50%  respiratory variability, suggesting right  atrial pressure of 3 mmHg.   Cardiac catheterization 09/29/2020 The left ventricular systolic function is normal. The left ventricular ejection fraction is 55-65% by visual estimate -suggestion of anterolateral hypokinesis LV end diastolic pressure is normal. No  MR or AI ---------------------------------------- CULPRIT LESION: Ramus lesion is 100% stenosed. A drug-eluting stent was successfully placed using a STENT RESOLUTE ONYX 2.0X15. Postdilated to 2.3 mm Post intervention, there is a 0% residual stenosis. Dist LAD-1 lesion is 30% stenosed. Dist LAD-2 lesion is 45% stenosed. With diffuse mild-mild to moderate disease throughout the RCA and LCx stent.   SUMMARY Severe single-vessel disease with 100% occluded ostial and proximal Ramus Intermedius with otherwise mild to moderate disease elsewhere (mostly mild but moderate in the distal LAD) Successful DES PCI of Ramus Intermedius using resolute Onyx DES 2.0 mm x 15 mm postdilated to 2.3 mm. Normal LVEDP with mild lateral hypokinesis otherwise preserved EF.     RECOMMENDATIONS Has admit orders to the ICU, however could be transferred to [facility] or [facility] stepdown units if ICU beds become urgent Check 2D echo tomorrow Guideline Directed Medical Therapy: Start low-dose carvedilol, high-dose high intensity statin along with DAPT. Expect that she can probably be Fast-Track discharge on 11/22.     Glenetta Hew, MD   Diagnostic Dominance: Right Intervention    Assessment & Plan   1.  Coronary artery disease-no further episodes of arm neck back or chest discomfort.  STEMI with PCI and DES to her ostial ramus.  Echocardiogram showed normal LVEF.  Details above. Continue  Brilinta, carvedilol,  nitroglycerin, atorvastatin Stop aspirin Change Brilinta to 60 mg twice daily Heart healthy low-sodium diet-salty 6 given Increase physical activity as tolerated Will reach out to Dr. Ellyn Hack about changes in Brilinta dosing and aspirin.  Essential  hypertension-BP today 112/76.  Well-controlled at home. Continue carvedilol,  Heart healthy low-sodium diet-salty 6 given Increase physical activity as tolerated  Hyperlipidemia-05/08/2021: Cholesterol, Total 136; HDL 60; LDL Chol Calc (NIH) 62; Triglycerides 67 Continue aspirin, atorvastatin Heart healthy low-sodium high-fiber diet Increase physical activity as tolerated Repeat fasting lipids and LFTs 6/23  Disposition: Follow-up with Dr. Ellyn Hack or me in 6 months.  Jossie Ng. Kanita Delage NP-C    10/01/2021, 9:01 AM [place] [address] Office [phone] Fax [phone]  Notice: This dictation was prepared with Dragon dictation along with smaller phrase technology. Any transcriptional errors that result from this process are unintentional and may not be corrected upon review.  I spent 13 minutes examining this patient, reviewing medications, and using patient centered shared decision making involving her cardiac care.  Prior to her visit I spent greater than 20 minutes reviewing her past medical history,  medications, and prior cardiac tests.

## 2021-10-01 ENCOUNTER — Other Ambulatory Visit: Payer: Self-pay

## 2021-10-01 ENCOUNTER — Encounter: Payer: Self-pay | Admitting: General Practice

## 2021-10-01 ENCOUNTER — Ambulatory Visit: Payer: BC Managed Care – PPO | Admitting: General Practice

## 2021-10-01 VITALS — BP 112/76 | HR 62 | Resp 16 | Ht 62.0 in | Wt 168.0 lb

## 2021-10-01 DIAGNOSIS — E785 Hyperlipidemia, unspecified: Secondary | ICD-10-CM | POA: Diagnosis not present

## 2021-10-01 DIAGNOSIS — I2510 Atherosclerotic heart disease of native coronary artery without angina pectoris: Secondary | ICD-10-CM

## 2021-10-01 DIAGNOSIS — I10 Essential (primary) hypertension: Secondary | ICD-10-CM

## 2021-10-01 DIAGNOSIS — I259 Chronic ischemic heart disease, unspecified: Secondary | ICD-10-CM | POA: Diagnosis not present

## 2021-10-01 DIAGNOSIS — Z9861 Coronary angioplasty status: Secondary | ICD-10-CM | POA: Diagnosis not present

## 2021-10-01 MED ORDER — TICAGRELOR 60 MG PO TABS: 60.0000 mg | ORAL_TABLET | Freq: Two times a day (BID) | ORAL | 3 refills | Status: DC

## 2021-10-01 NOTE — Addendum Note (Signed)
Addended by: Velora Mediate F on: 10/01/2021 01:36 PM   Modules accepted: Orders

## 2021-10-01 NOTE — Patient Instructions (Signed)
Medication Instructions:  Your Physician recommend you continue on your current medication as directed.    *If you need a refill on your cardiac medications before your next appointment, please call your pharmacy*   Lab Work: In June of 2023 get fasting blood work (liver panel and lipids)  If you have labs (blood work) drawn today and your tests are completely normal, you will receive your results only by: MyChart Message (if you have MyChart) OR A paper copy in the mail If you have any lab test that is abnormal or we need to change your treatment, we will call you to review the results.   Testing/Procedures: None   Follow-Up: At [facility], you and your health needs are our priority.  As part of our continuing mission to provide you with exceptional heart care, we have created designated Provider Care Teams.  These Care Teams include your primary Cardiologist (physician) and Advanced Practice Providers (APPs -  Physician Assistants and Nurse Practitioners) who all work together to provide you with the care you need, when you need it.  We recommend signing up for the patient portal called "MyChart".  Sign up information is provided on this After Visit Summary.  MyChart is used to connect with patients for Virtual Visits (Telemedicine).  Patients are able to view lab/test results, encounter notes, upcoming appointments, etc.  Non-urgent messages can be sent to your provider as well.   To learn more about what you can do with MyChart, go to [email or web address].    Your next appointment:   6 month(s)  The format for your next appointment:   In Person  Provider:   Bryan Lemma, MD    Other Instructions  Continue with physical activity  High-Fiber Eating Plan Fiber, also called dietary fiber, is a type of carbohydrate. It is found foods such as fruits, vegetables, whole grains, and beans. A high-fiber diet can have many health benefits. Your health care provider may  recommend a high-fiber diet to help: Prevent constipation. Fiber can make your bowel movements more regular. Lower your cholesterol. Relieve the following conditions: Inflammation of veins in the anus (hemorrhoids). Inflammation of specific areas of the digestive tract (uncomplicated diverticulosis). A problem of the large intestine, also called the colon, that sometimes causes pain and diarrhea (irritable bowel syndrome, or IBS). Prevent overeating as part of a weight-loss plan. Prevent heart disease, type 2 diabetes, and certain cancers. What are tips for following this plan? Reading food labels  Check the nutrition facts label on food products for the amount of dietary fiber. Choose foods that have 5 grams of fiber or more per serving. The goals for recommended daily fiber intake include: Men (age 22 or younger): 34-38 g. Men (over age 50): 28-34 g. Women (age 64 or younger): 25-28 g. Women (over age 82): 22-25 g. Your daily fiber goal is _____________ g. Shopping Choose whole fruits and vegetables instead of processed forms, such as apple juice or applesauce. Choose a wide variety of high-fiber foods such as avocados, lentils, oats, and kidney beans. Read the nutrition facts label of the foods you choose. Be aware of foods with added fiber. These foods often have high sugar and sodium amounts per serving. Cooking Use whole-grain flour for baking and cooking. Cook with brown rice instead of white rice. Meal planning Start the day with a breakfast that is high in fiber, such as a cereal that contains 5 g of fiber or more per serving. Eat breads and cereals that are  made with whole-grain flour instead of refined flour or white flour. Eat brown rice, bulgur wheat, or millet instead of white rice. Use beans in place of meat in soups, salads, and pasta dishes. Be sure that half of the grains you eat each day are whole grains. General information You can get the recommended daily intake  of dietary fiber by: Eating a variety of fruits, vegetables, grains, nuts, and beans. Taking a fiber supplement if you are not able to take in enough fiber in your diet. It is better to get fiber through food than from a supplement. Gradually increase how much fiber you consume. If you increase your intake of dietary fiber too quickly, you may have bloating, cramping, or gas. Drink plenty of water to help you digest fiber. Choose high-fiber snacks, such as berries, raw vegetables, nuts, and popcorn. What foods should I eat? Fruits Berries. Pears. Apples. Oranges. Avocado. Prunes and raisins. Dried figs. Vegetables Sweet potatoes. Spinach. Kale. Artichokes. Cabbage. Broccoli. Cauliflower. Green peas. Carrots. Squash. Grains Whole-grain breads. Multigrain cereal. Oats and oatmeal. Brown rice. Barley. Bulgur wheat. Millet. Quinoa. Bran muffins. Popcorn. Rye wafer crackers. Meats and other proteins Navy beans, kidney beans, and pinto beans. Soybeans. Split peas. Lentils. Nuts and seeds. Dairy Fiber-fortified yogurt. Beverages Fiber-fortified soy milk. Fiber-fortified orange juice. Other foods Fiber bars. The items listed above may not be a complete list of recommended foods and beverages. Contact a dietitian for more information. What foods should I avoid? Fruits Fruit juice. Cooked, strained fruit. Vegetables Fried potatoes. Canned vegetables. Well-cooked vegetables. Grains White bread. Pasta made with refined flour. White rice. Meats and other proteins Fatty cuts of meat. Fried chicken or fried fish. Dairy Milk. Yogurt. Cream cheese. Sour cream. Fats and oils Butters. Beverages Soft drinks. Other foods Cakes and pastries. The items listed above may not be a complete list of foods and beverages to avoid. Talk with your dietitian about what choices are best for you. Summary Fiber is a type of carbohydrate. It is found in foods such as fruits, vegetables, whole grains, and  beans. A high-fiber diet has many benefits. It can help to prevent constipation, lower blood cholesterol, aid weight loss, and reduce your risk of heart disease, diabetes, and certain cancers. Increase your intake of fiber gradually. Increasing fiber too quickly may cause cramping, bloating, and gas. Drink plenty of water while you increase the amount of fiber you consume. The best sources of fiber include whole fruits and vegetables, whole grains, nuts, seeds, and beans. This information is not intended to replace advice given to you by your health care provider. Make sure you discuss any questions you have with your health care provider. Document Revised: 03/01/2020 Document Reviewed: 03/01/2020 Elsevier Patient Education  2022 [employer].

## 2021-10-30 ENCOUNTER — Emergency Department (HOSPITAL_BASED_OUTPATIENT_CLINIC_OR_DEPARTMENT_OTHER): Payer: BC Managed Care – PPO

## 2021-10-30 ENCOUNTER — Encounter (HOSPITAL_BASED_OUTPATIENT_CLINIC_OR_DEPARTMENT_OTHER): Payer: Self-pay | Admitting: Emergency Medicine

## 2021-10-30 ENCOUNTER — Emergency Department (HOSPITAL_BASED_OUTPATIENT_CLINIC_OR_DEPARTMENT_OTHER)
Admission: EM | Admit: 2021-10-30 | Discharge: 2021-10-31 | Disposition: A | Discharge status: Home / Self Care | Payer: BC Managed Care – PPO | Attending: Emergency Medicine | Admitting: Emergency Medicine

## 2021-10-30 DIAGNOSIS — R112 Nausea with vomiting, unspecified: Secondary | ICD-10-CM | POA: Diagnosis not present

## 2021-10-30 DIAGNOSIS — R0789 Other chest pain: Secondary | ICD-10-CM

## 2021-10-30 DIAGNOSIS — I119 Hypertensive heart disease without heart failure: Secondary | ICD-10-CM | POA: Diagnosis not present

## 2021-10-30 DIAGNOSIS — J189 Pneumonia, unspecified organism: Secondary | ICD-10-CM | POA: Diagnosis not present

## 2021-10-30 DIAGNOSIS — R079 Chest pain, unspecified: Secondary | ICD-10-CM | POA: Diagnosis not present

## 2021-10-30 DIAGNOSIS — I2510 Atherosclerotic heart disease of native coronary artery without angina pectoris: Secondary | ICD-10-CM | POA: Insufficient documentation

## 2021-10-30 DIAGNOSIS — R110 Nausea: Secondary | ICD-10-CM | POA: Diagnosis not present

## 2021-10-30 DIAGNOSIS — T07XXXA Unspecified multiple injuries, initial encounter: Secondary | ICD-10-CM | POA: Diagnosis not present

## 2021-10-30 DIAGNOSIS — R051 Acute cough: Secondary | ICD-10-CM

## 2021-10-30 DIAGNOSIS — J181 Lobar pneumonia, unspecified organism: Secondary | ICD-10-CM | POA: Insufficient documentation

## 2021-10-30 DIAGNOSIS — R059 Cough, unspecified: Secondary | ICD-10-CM | POA: Diagnosis not present

## 2021-10-30 LAB — BASIC METABOLIC PANEL
Anion gap: 9 (ref 5–15)
BUN: 9 mg/dL (ref 6–20)
CO2: 22 mmol/L (ref 22–32)
Calcium: 8.9 mg/dL (ref 8.9–10.3)
Chloride: 106 mmol/L (ref 98–111)
Creatinine, Ser: 0.73 mg/dL (ref 0.44–1.00)
GFR, Estimated: >60 mL/min (ref >60)
Glucose, Bld: 110 mg/dL — ABNORMAL HIGH (ref 70–99)
Potassium: 3.6 mmol/L (ref 3.5–5.1)
Sodium: 137 mmol/L (ref 135–145)

## 2021-10-30 LAB — CBC
HCT: 34.7 % — ABNORMAL LOW (ref 36.0–46.0)
Hemoglobin: 11.3 g/dL — ABNORMAL LOW (ref 12.0–15.0)
MCH: 29.4 pg (ref 26.0–34.0)
MCHC: 32.6 g/dL (ref 30.0–36.0)
MCV: 90.1 fL (ref 80.0–100.0)
Platelets: 269 10*3/uL (ref 150–400)
RBC: 3.85 MIL/uL — ABNORMAL LOW (ref 3.87–5.11)
RDW: 13.7 % (ref 11.5–15.5)
WBC: 7.9 10*3/uL (ref 4.0–10.5)
nRBC: 0 % (ref 0.0–0.2)

## 2021-10-30 LAB — HEPATIC FUNCTION PANEL
ALT: 20 U/L (ref 0–44)
AST: 23 U/L (ref 15–41)
Albumin: 4 g/dL (ref 3.5–5.0)
Alkaline Phosphatase: 75 U/L (ref 38–126)
Bilirubin, Direct: 0.1 mg/dL (ref 0.0–0.2)
Indirect Bilirubin: 0.4 mg/dL (ref 0.3–0.9)
Total Bilirubin: 0.5 mg/dL (ref 0.3–1.2)
Total Protein: 7.5 g/dL (ref 6.5–8.1)

## 2021-10-30 LAB — TROPONIN I (HIGH SENSITIVITY)
Troponin I (High Sensitivity): <2 ng/L (ref <18)
Troponin I (High Sensitivity): <2 ng/L (ref <18)

## 2021-10-30 LAB — LIPASE, BLOOD: Lipase: 28 U/L (ref 11–51)

## 2021-10-30 MED ORDER — CYCLOBENZAPRINE HCL 10 MG PO TABS: 10.0000 mg | ORAL_TABLET | Freq: Two times a day (BID) | ORAL | 0 refills | Status: DC | PRN

## 2021-10-30 MED ORDER — ONDANSETRON HCL 4 MG PO TABS: 4.0000 mg | ORAL_TABLET | Freq: Three times a day (TID) | ORAL | 0 refills | Status: AC | PRN

## 2021-10-30 MED ORDER — ONDANSETRON HCL 4 MG/2ML IJ SOLN
4.0000 mg | Freq: Once | INTRAMUSCULAR | Status: AC
  Administered 2021-10-30: 21:00:00 4 mg via INTRAVENOUS
  Filled 2021-10-30: qty 2

## 2021-10-30 MED ORDER — DOXYCYCLINE HYCLATE 100 MG PO CAPS: 100.0000 mg | ORAL_CAPSULE | Freq: Two times a day (BID) | ORAL | 0 refills | Status: AC

## 2021-10-30 NOTE — Discharge Instructions (Signed)
Your history, exam, work-up today are suggestive of a subtle pneumonia causing the coughing which may have contributed to some of the likely chest wall discomfort causing the chest pain.  Your heart enzymes were negative both times we checked them.  Your x-ray did show possible left-sided early pneumonia so please consider taking the antibiotics to treat.  For the nausea and vomiting please take the nausea medicine and rest and stay hydrated.  Please consider taking the muscle relaxant to help with any muscle spasm and pain on your chest wall.  Please follow-up with your primary doctor and your cardiologist if any symptoms change or worsen acutely, please return to the nearest emergency department.

## 2021-10-30 NOTE — ED Triage Notes (Signed)
Pt state having right sided chest pressure and head not feeling right. Started about 330p-400p tried to lay down but not feeling better.

## 2021-10-30 NOTE — ED Provider Notes (Signed)
[facility] EMERGENCY DEPARTMENT Provider Note   CSN: [id number] Arrival date & time: 10/30/21  1916     History No chief complaint on file.   Renee Fitzgerald is a 45 y.o. female.  The history is provided by the patient and medical records. No language interpreter was used.  Chest Pain Pain location:  Substernal area, R chest and L chest Pain quality: pressure and tightness   Pain radiates to:  Does not radiate Pain severity:  Moderate Onset quality:  Gradual Duration:  3 hours Timing:  Constant Progression:  Improving Chronicity:  Recurrent Relieved by:  Nothing Worsened by:  Coughing Ineffective treatments:  None tried Associated symptoms: cough, nausea and vomiting   Associated symptoms: no abdominal pain, no altered mental status, no anxiety, no back pain, no fatigue, no fever, no headache, no palpitations and no shortness of breath       Past Medical History:  Diagnosis Date   Anemia    CAD S/P PCI of 100% ost-prox RI 09/29/2020   Lateral STEMI -> 100% ost-prox Diag - Resolute Onyx DES 2.0 x 15 mm - 2.3 mm   Essential hypertension 10/01/2020   Hyperlipidemia with target LDL less than 70 10/01/2020   Migraine    ST elevation myocardial infarction (STEMI) of lateral wall (HCC) 09/29/2020   RI 100% - DES PCI.     Patient Active Problem List   Diagnosis Date Noted   Hyperlipidemia with target LDL less than 70 10/01/2020   Essential hypertension 10/01/2020   ST elevation myocardial infarction (STEMI) of lateral wall (HCC) 09/29/2020   CAD S/P PCI of 100% ost-prox RI 09/29/2020    Past Surgical History:  Procedure Laterality Date   CORONARY STENT INTERVENTION N/A 09/29/2020   Procedure: CORONARY STENT INTERVENTION;  Surgeon: Marykay Lex, MD;  Location: [facility] INVASIVE CV LAB;  Service: Cardiovascular:  Culprit Lesion -> ostial RI 100% (DES PCI-Resolute Onyx 2.0 mm x 15 mm--2.3 mm).   CORONARY/GRAFT ACUTE MI REVASCULARIZATION N/A 09/29/2020    Procedure: Coronary/Graft Acute MI Revascularization;  Surgeon: Marykay Lex, MD;  Location: [facility] INVASIVE CV LAB;  Service: Cardiovascular;: Insetting of lateral STEMI: PTCA-DES PCI of culprit lesion-ostial RI 100% thrombotic occlusion   LEFT HEART CATH AND CORONARY ANGIOGRAPHY N/A 09/29/2020   Procedure: LEFT HEART CATH AND CORONARY ANGIOGRAPHY;  Surgeon: Marykay Lex, MD;  Location: [facility] INVASIVE CV LAB;  Service: Cardiovascular: (LATERAL STEMI) \: Culprit Lesion -> ostial RI 100% (DES PCI).  Distal LAD 30% RCA and LCx.  EF 55 to 60% anterolateral HK.   TRANSTHORACIC ECHOCARDIOGRAM  09/30/2020   Lateral STEMI:  EF 60-65%.  No R WMA.  Normal diastolic pressures.  Normal valves.   WISDOM TOOTH EXTRACTION       OB History   No obstetric history on file.     Family History  Problem Relation Age of Onset   Healthy Mother    Healthy Father    Heart attack Paternal Grandfather 90   CAD Paternal Grandfather 39    Social History   Tobacco Use   Smoking status: Never   Smokeless tobacco: Never  Substance Use Topics   Alcohol use: No   Drug use: No    Home Medications Prior to Admission medications   Medication Sig Start Date End Date Taking? Authorizing Provider  ALPRAZolam Prudy Feeler) 0.25 MG tablet Take 0.25 mg by mouth daily as needed for anxiety.  06/11/20   [provider]  atorvastatin (LIPITOR) 80 MG  tablet Take 0.5 tablets (40 mg total) by mouth daily. 03/11/21   Marykay Lex, MD  carvedilol (COREG) 3.125 MG tablet Take 1 tablet (3.125 mg total) by mouth 2 (two) times daily with a meal. 02/18/21   Marykay Lex, MD  isosorbide mononitrate (IMDUR) 30 MG 24 hr tablet TAKE 1 TABLET BY MOUTH ONCE DAILY 10/05/20 10/05/21  Jacalyn Lefevre, MD  Melatonin 10 MG TABS Take 10 mg by mouth at bedtime.    [provider]  Multiple Vitamin (MULTIVITAMIN WITH MINERALS) TABS tablet Take 1 tablet by mouth once a week.    [provider]  nitroGLYCERIN (NITROSTAT)  0.4 MG SL tablet Place 1 tablet (0.4 mg total) under the tongue every 5 (five) minutes as needed. 10/01/20   Arty Baumgartner, NP  omeprazole (PRILOSEC) 20 MG capsule Take 20 mg by mouth daily as needed (acid reflux).  07/20/20   [provider]  pantoprazole (PROTONIX) 40 MG tablet Take by mouth.    [provider]  promethazine (PHENERGAN) 25 MG tablet Take 25 mg by mouth daily as needed for nausea or vomiting.  06/06/20   [provider]  ticagrelor (BRILINTA) 60 MG TABS tablet Take 1 tablet (60 mg total) by mouth 2 (two) times daily. 10/01/21   Ronney Asters, NP  Ubrogepant (UBRELVY) 100 MG TABS Take by mouth. 10/23/20   [provider]  zolpidem (AMBIEN) 10 MG tablet Take 10 mg by mouth at bedtime as needed for sleep. 09/10/20   [provider]    Allergies    Penicillins  Review of Systems   Review of Systems  Constitutional:  Negative for chills, fatigue and fever.  HENT:  Negative for congestion.   Respiratory:  Positive for cough. Negative for chest tightness, shortness of breath and wheezing.   Cardiovascular:  Positive for chest pain. Negative for palpitations and leg swelling.  Gastrointestinal:  Positive for nausea and vomiting. Negative for abdominal pain, constipation and diarrhea.  Genitourinary:  Negative for flank pain.  Musculoskeletal:  Negative for back pain.  Neurological:  Negative for light-headedness and headaches.  Psychiatric/Behavioral:  Negative for agitation.   All other systems reviewed and are negative.  Physical Exam Updated Vital Signs BP 111/73    Pulse 70    Temp 98.4 F (36.9 C) (Oral)    Resp 13    Ht  (1.575 m)    Wt 74.4 kg    LMP 10/28/2021    SpO2 100%    BMI 30.00 kg/m   Physical Exam Vitals and nursing note reviewed.  Constitutional:      General: She is not in acute distress.    Appearance: She is well-developed. She is not ill-appearing, toxic-appearing or diaphoretic.  HENT:      Head: Normocephalic and atraumatic.  Eyes:     Conjunctiva/sclera: Conjunctivae normal.     Pupils: Pupils are equal, round, and reactive to light.  Cardiovascular:     Rate and Rhythm: Normal rate and regular rhythm.     Heart sounds: No murmur heard. Pulmonary:     Effort: Pulmonary effort is normal. No respiratory distress.     Breath sounds: Normal breath sounds. No decreased breath sounds, wheezing, rhonchi or rales.  Abdominal:     Palpations: Abdomen is soft.     Tenderness: There is no abdominal tenderness.  Musculoskeletal:        General: No swelling.     Cervical back: Neck supple.  Right lower leg: No tenderness. No edema.     Left lower leg: No tenderness. No edema.  Skin:    General: Skin is warm and dry.     Capillary Refill: Capillary refill takes less than 2 seconds.     Findings: No erythema.  Neurological:     General: No focal deficit present.     Mental Status: She is alert.  Psychiatric:        Mood and Affect: Mood normal.    ED Results / Procedures / Treatments   Labs (all labs ordered are listed, but only abnormal results are displayed) Labs Reviewed  BASIC METABOLIC PANEL - Abnormal; Notable for the following components:      Result Value   Glucose, Bld 110 (*)    All other components within normal limits  CBC - Abnormal; Notable for the following components:   RBC 3.85 (*)    Hemoglobin 11.3 (*)    HCT 34.7 (*)    All other components within normal limits  HEPATIC FUNCTION PANEL  LIPASE, BLOOD  PREGNANCY, URINE  TROPONIN I (HIGH SENSITIVITY)  TROPONIN I (HIGH SENSITIVITY)    EKG EKG Interpretation  Date/Time:  Wednesday October 30 2021 19:24:09 EST Ventricular Rate:  70 PR Interval:  145 QRS Duration: 100 QT Interval:  399 QTC Calculation: 431 R Axis:   81 Text Interpretation: Sinus rhythm Consider inferior infarct When compared to prior, similar appearance. No STEMI Confirmed by Theda Belfast ([id number]) on 10/30/2021 8:03:21  PM  Radiology DG Chest 2 View  Result Date: 10/30/2021 CLINICAL DATA:  chest pressure EXAM: CHEST - 2 VIEW COMPARISON:  Chest x-ray 10/05/2020 FINDINGS: The heart and mediastinal contours are within normal limits. Low lung volumes. Vague left lower lobe airspace opacities. No pulmonary edema. No pleural effusion. No pneumothorax. No acute osseous abnormality. IMPRESSION: Vague left lower lobe airspace opacities. Finding may represent atelectasis in the setting of low lung volumes. Developing superimposed infection not excluded. Electronically Signed   By: Tish Frederickson M.D.   On: 10/30/2021 19:48    Procedures Procedures   Medications Ordered in ED Medications  ondansetron (ZOFRAN) injection 4 mg (4 mg Intravenous Given 10/30/21 2030)    ED Course  I have reviewed the triage vital signs and the nursing notes.  Pertinent labs & imaging results that were available during my care of the patient were reviewed by me and considered in my medical decision making (see chart for details).    MDM Rules/Calculators/A&P                          CHALEE HIROTA is a 45 y.o. female with a past medical history significant for hypertension, hyperlipidemia, and previous CAD with MI and PCI who presents with chest pain.  Corded patient around 3:30 PM today, she had onset of pain in her right central chest that radiates across her chest.  It is a pressure and aching and was associated with nausea and vomiting.  She denies shortness of breath and reports the symptoms do not appear to be pleuritic or exertional.  She denies any trauma or heavy lifting.  She reports this feels different than the discomfort she had with previous MI and does feel similar to when she is had some chest pain in the past that she had worked up and was reassuring.  Patient thinks that it could be musculoskeletal pain that has been worsened by anxiety but due to the  nausea vomiting and severe pain she is concerned and wanted to have  her troponins checked.  She denies any recent fevers, chills congestion, cough and denies any sick contacts.  Denies any constipation, diarrhea, or urinary changes.  Reports the pain is moderate to severe and she is concerned about it.  On exam, lungs are clear and chest is nontender.  Abdomen is nontender.  Normal pulses in extremities.  Legs do not feel tender or seem significantly edematous.  No murmur appreciated.  Patient is having nausea and vomiting however.  Vital signs reassuring on my evaluation.  EKG does not show STEMI.  After discussion with patient we agreed to get some basic labs including delta troponin.  She is not tachycardic and clinically at the moment I have a low suspicion for a thromboembolic PE because of her discomfort.  She is PERC negative.  Will get delta troponin and basic work-up and give her some nausea medicine.  Anticipate shared decision-making conversation after work-up to determine disposition.  X-ray shows evidence of possible left-sided pneumonia but otherwise no acute abnormality.  I spoke to patient and family had arrived and they do say that she has been coughing more this week.  Clinically I suspect she may have had some coughing causing chest wall pain and musculoskeletal discomfort as her troponin was negative x2.  Had a long shared decision made conversation and patient is amenable to nausea medicine to help with nausea vomiting, antibiotics for possible pneumonia, and muscle relaxant for chest wall discomfort.  She will call and follow-up with both her PCP and cardiology and understands return precautions.  She had no other questions or concerns and was discharged in good condition with reassuring otherwise work-up.   Final Clinical Impression(s) / ED Diagnoses Final diagnoses:  Atypical chest pain  Community acquired pneumonia of left lower lobe of lung  Acute cough  Nausea and vomiting, unspecified vomiting type    Rx / DC Orders ED Discharge  Orders          Ordered    doxycycline (VIBRAMYCIN) 100 MG capsule  2 times daily        10/30/21 2331    ondansetron (ZOFRAN) 4 MG tablet  Every 8 hours PRN        10/30/21 2331    cyclobenzaprine (FLEXERIL) 10 MG tablet  2 times daily PRN        10/30/21 2331            Clinical Impression: 1. Atypical chest pain   2. Community acquired pneumonia of left lower lobe of lung   3. Acute cough   4. Nausea and vomiting, unspecified vomiting type     Disposition: Discharge  Condition: Good  I have discussed the results, Dx and Tx plan with the pt(& family if present). He/she/they expressed understanding and agree(s) with the plan. Discharge instructions discussed at great length. Strict return precautions discussed and pt &/or family have verbalized understanding of the instructions. No further questions at time of discharge.    New Prescriptions   CYCLOBENZAPRINE (FLEXERIL) 10 MG TABLET    Take 1 tablet (10 mg total) by mouth 2 (two) times daily as needed for muscle spasms.   DOXYCYCLINE (VIBRAMYCIN) 100 MG CAPSULE    Take 1 capsule (100 mg total) by mouth 2 (two) times daily for 7 days.   ONDANSETRON (ZOFRAN) 4 MG TABLET    Take 1 tablet (4 mg total) by mouth every 8 (eight) hours as needed for  nausea or vomiting.    Follow Up: Ursula Beath, MD [address] [place] [address] [phone]     Your cardiologist        Anelly Samarin, Canary Brim, MD 10/30/21 [phone]

## 2021-11-26 ENCOUNTER — Ambulatory Visit: Payer: Self-pay

## 2021-11-26 DIAGNOSIS — J0100 Acute maxillary sinusitis, unspecified: Secondary | ICD-10-CM | POA: Diagnosis not present

## 2021-11-27 ENCOUNTER — Other Ambulatory Visit: Payer: Self-pay | Admitting: Cardiology

## 2021-11-30 ENCOUNTER — Other Ambulatory Visit: Payer: Self-pay | Admitting: Cardiology

## 2021-12-01 ENCOUNTER — Other Ambulatory Visit: Payer: Self-pay | Admitting: Cardiology

## 2021-12-31 ENCOUNTER — Encounter: Payer: Self-pay | Admitting: Cardiology

## 2022-01-24 DIAGNOSIS — G43009 Migraine without aura, not intractable, without status migrainosus: Secondary | ICD-10-CM | POA: Diagnosis not present

## 2022-01-24 DIAGNOSIS — F5104 Psychophysiologic insomnia: Secondary | ICD-10-CM | POA: Diagnosis not present

## 2022-01-24 DIAGNOSIS — F419 Anxiety disorder, unspecified: Secondary | ICD-10-CM | POA: Diagnosis not present

## 2022-01-24 DIAGNOSIS — M545 Low back pain, unspecified: Secondary | ICD-10-CM | POA: Diagnosis not present

## 2022-02-03 ENCOUNTER — Ambulatory Visit: Payer: BC Managed Care – PPO | Admitting: Cardiology

## 2022-02-21 DIAGNOSIS — M5441 Lumbago with sciatica, right side: Secondary | ICD-10-CM | POA: Diagnosis not present

## 2022-03-01 ENCOUNTER — Other Ambulatory Visit: Payer: Self-pay | Admitting: Cardiology

## 2022-03-06 ENCOUNTER — Ambulatory Visit: Payer: BC Managed Care – PPO | Admitting: Family Medicine

## 2022-03-06 ENCOUNTER — Encounter: Payer: Self-pay | Admitting: Family Medicine

## 2022-03-06 ENCOUNTER — Ambulatory Visit: Payer: Self-pay

## 2022-03-06 ENCOUNTER — Other Ambulatory Visit (HOSPITAL_BASED_OUTPATIENT_CLINIC_OR_DEPARTMENT_OTHER): Payer: Self-pay

## 2022-03-06 VITALS — BP 118/70 | Ht 62.0 in | Wt 170.0 lb

## 2022-03-06 DIAGNOSIS — M7061 Trochanteric bursitis, right hip: Secondary | ICD-10-CM | POA: Diagnosis not present

## 2022-03-06 DIAGNOSIS — M25552 Pain in left hip: Secondary | ICD-10-CM | POA: Insufficient documentation

## 2022-03-06 DIAGNOSIS — M5416 Radiculopathy, lumbar region: Secondary | ICD-10-CM | POA: Insufficient documentation

## 2022-03-06 MED ORDER — METHYLPREDNISOLONE ACETATE 40 MG/ML IJ SUSP
40.0000 mg | Freq: Once | INTRAMUSCULAR | Status: AC
  Administered 2022-03-06: 40 mg via INTRA_ARTICULAR

## 2022-03-06 MED ORDER — HYDROCODONE-ACETAMINOPHEN 5-325 MG PO TABS
1.0000 | ORAL_TABLET | Freq: Three times a day (TID) | ORAL | 0 refills | Status: DC | PRN
  Filled 2022-03-06: qty 10, 4d supply, fill #0

## 2022-03-06 NOTE — Progress Notes (Signed)
?Renee Fitzgerald - 46 y.o. female MRN [id number]  Date of birth: January 09, 1976 ? ?SUBJECTIVE:  Including CC & ROS.  ?No chief complaint on file. ? ? ?Renee Fitzgerald is a 46 y.o. female that is presenting with right hip pain and right leg pain.  The pain has been ongoing for few weeks.  She reports the pain being severe in nature.  Seem to be exacerbated with physical therapy.  No recent injury inciting event.. ? ?Reviewed the office note from 3/17 shows she was counseled on supportive care. ? ? ?Review of Systems ?See HPI  ? ?HISTORY: Past Medical, Surgical, Social, and Family History Reviewed & Updated per EMR.   ?Pertinent Historical Findings include: ? ?Past Medical History:  ?Diagnosis Date  ? Anemia   ? CAD S/P PCI of 100% ost-prox RI 09/29/2020  ? Lateral STEMI -> 100% ost-prox Diag - Resolute Onyx DES 2.0 x 15 mm - 2.3 mm  ? Essential hypertension 10/01/2020  ? Hyperlipidemia with target LDL less than 70 10/01/2020  ? Migraine   ? ST elevation myocardial infarction (STEMI) of lateral wall (HCC) 09/29/2020  ? RI 100% - DES PCI.   ? ? ?Past Surgical History:  ?Procedure Laterality Date  ? CORONARY STENT INTERVENTION N/A 09/29/2020  ? Procedure: CORONARY STENT INTERVENTION;  Surgeon: Marykay Lex, MD;  Location: [facility] INVASIVE CV LAB;  Service: Cardiovascular:  Culprit Lesion -> ostial RI 100% (DES PCI-Resolute Onyx 2.0 mm x 15 mm--2.3 mm).  ? CORONARY/GRAFT ACUTE MI REVASCULARIZATION N/A 09/29/2020  ? Procedure: Coronary/Graft Acute MI Revascularization;  Surgeon: Marykay Lex, MD;  Location: [facility] INVASIVE CV LAB;  Service: Cardiovascular;: Insetting of lateral STEMI: PTCA-DES PCI of culprit lesion-ostial RI 100% thrombotic occlusion  ? LEFT HEART CATH AND CORONARY ANGIOGRAPHY N/A 09/29/2020  ? Procedure: LEFT HEART CATH AND CORONARY ANGIOGRAPHY;  Surgeon: Marykay Lex, MD;  Location: [facility] INVASIVE CV LAB;  Service: Cardiovascular: (LATERAL STEMI) \: Culprit Lesion -> ostial RI 100% (DES PCI).  Distal LAD  30% RCA and LCx.  EF 55 to 60% anterolateral HK.  ? TRANSTHORACIC ECHOCARDIOGRAM  09/30/2020  ? Lateral STEMI:  EF 60-65%.  No R WMA.  Normal diastolic pressures.  Normal valves.  ? WISDOM TOOTH EXTRACTION    ? ? ? ?PHYSICAL EXAM:  ?VS: BP 118/70 (BP Location: Left Arm, Patient Position: Sitting)   Ht 5\' 2"  (1.575 m)   Wt 170 lb (77.1 kg)   BMI 31.09 kg/m?  ?Physical Exam ?Gen: NAD, alert, cooperative with exam, well-appearing ?MSK:  ?Neurovascularly intact   ? ? ?Aspiration/Injection Procedure Note ? ?[birth year] ? ?Procedure: Injection ?Indications: Right hip pain ? ?Procedure Details ?Consent: Risks of procedure as well as the alternatives and risks of each were explained to the (patient/caregiver).  Consent for procedure obtained. ?Time Out: Verified patient identification, verified procedure, site/side was marked, verified correct patient position, special equipment/implants available, medications/allergies/relevent history reviewed, required imaging and test results available.  Performed.  The area was cleaned with iodine and alcohol swabs.   ? ?The right trochanteric bursa was injected using 3 cc of 1% lidocaine on a 22-gauge 3-1/2 inch needle.  The syringe was switched and a mixture containing 1 cc's of 40 mg Depo-Medrol and 4 cc's of 0.25% bupivacaine was injected.  Ultrasound was used. Images were obtained in short views showing the injection.   ? ? ?A sterile dressing was applied. ? ?Patient did tolerate procedure well. ? ? ? ? ?ASSESSMENT & PLAN:  ? ?  Lumbar radiculopathy ?Acutely occurring.  Does have pain on the right leg more consistent with a radicular pain. ?-Counseled on home exercise therapy and supportive care. ?-Norco. ?-Could consider further imaging ?-Placed physical therapy on hold. ? ?Trochanteric bursitis of right hip ?Acutely occurring.  Does have tenderness localized to the lateral aspect of the right hip. ?-Counseled on home exercise therapy and supportive  care. ?-Injection. ?-Could consider shockwave therapy. ? ? ? ? ?

## 2022-03-06 NOTE — Patient Instructions (Signed)
Nice to meet you ?Please try ice  ?Please try the exercises  ?Please use the pain medicine as needed  ?Please send me a message in MyChart with any questions or updates.  ?Please see me back in 2-3 weeks.  ? ?--Dr. Jordan Likes ? ?

## 2022-03-06 NOTE — Assessment & Plan Note (Signed)
Acutely occurring.  Does have pain on the right leg more consistent with a radicular pain. ?-Counseled on home exercise therapy and supportive care. ?-Norco. ?-Could consider further imaging ?-Placed physical therapy on hold. ?

## 2022-03-06 NOTE — Assessment & Plan Note (Signed)
Acutely occurring.  Does have tenderness localized to the lateral aspect of the right hip. ?-Counseled on home exercise therapy and supportive care. ?-Injection. ?-Could consider shockwave therapy. ?

## 2022-03-20 ENCOUNTER — Encounter: Payer: Self-pay | Admitting: Family Medicine

## 2022-03-20 ENCOUNTER — Ambulatory Visit: Payer: BC Managed Care – PPO | Admitting: Family Medicine

## 2022-03-20 DIAGNOSIS — M7061 Trochanteric bursitis, right hip: Secondary | ICD-10-CM | POA: Diagnosis not present

## 2022-03-20 NOTE — Assessment & Plan Note (Signed)
Doing well since the injection.  Pain is almost resolved. ?-Counseled on home exercise therapy and supportive care. ?-Could consider shockwave therapy going forward. ?

## 2022-03-20 NOTE — Progress Notes (Signed)
?  Renee Fitzgerald - 46 y.o. female MRN [id number]  Date of birth: [birth year] ? ?SUBJECTIVE:  Including CC & ROS.  ?No chief complaint on file. ? ? ?Renee Fitzgerald is a 46 y.o. female that is following up for her right hip pain.  She has done significantly well since the injection.  Her pain is almost gone.  She is able to do her normal activities. ? ? ?Review of Systems ?See HPI  ? ?HISTORY: Past Medical, Surgical, Social, and Family History Reviewed & Updated per EMR.   ?Pertinent Historical Findings include: ? ?Past Medical History:  ?Diagnosis Date  ? Anemia   ? CAD S/P PCI of 100% ost-prox RI 09/29/2020  ? Lateral STEMI -> 100% ost-prox Diag - Resolute Onyx DES 2.0 x 15 mm - 2.3 mm  ? Essential hypertension 10/01/2020  ? Hyperlipidemia with target LDL less than 70 10/01/2020  ? Migraine   ? ST elevation myocardial infarction (STEMI) of lateral wall (HCC) 09/29/2020  ? RI 100% - DES PCI.   ? ? ?Past Surgical History:  ?Procedure Laterality Date  ? CORONARY STENT INTERVENTION N/A 09/29/2020  ? Procedure: CORONARY STENT INTERVENTION;  Surgeon: Marykay Lex, MD;  Location: [facility] INVASIVE CV LAB;  Service: Cardiovascular:  Culprit Lesion -> ostial RI 100% (DES PCI-Resolute Onyx 2.0 mm x 15 mm--2.3 mm).  ? CORONARY/GRAFT ACUTE MI REVASCULARIZATION N/A 09/29/2020  ? Procedure: Coronary/Graft Acute MI Revascularization;  Surgeon: Marykay Lex, MD;  Location: [facility] INVASIVE CV LAB;  Service: Cardiovascular;: Insetting of lateral STEMI: PTCA-DES PCI of culprit lesion-ostial RI 100% thrombotic occlusion  ? LEFT HEART CATH AND CORONARY ANGIOGRAPHY N/A 09/29/2020  ? Procedure: LEFT HEART CATH AND CORONARY ANGIOGRAPHY;  Surgeon: Marykay Lex, MD;  Location: [facility] INVASIVE CV LAB;  Service: Cardiovascular: (LATERAL STEMI) \: Culprit Lesion -> ostial RI 100% (DES PCI).  Distal LAD 30% RCA and LCx.  EF 55 to 60% anterolateral HK.  ? TRANSTHORACIC ECHOCARDIOGRAM  09/30/2020  ? Lateral STEMI:  EF 60-65%.  No R WMA.  Normal  diastolic pressures.  Normal valves.  ? WISDOM TOOTH EXTRACTION    ? ? ? ?PHYSICAL EXAM:  ?VS: BP 92/68 (BP Location: Left Arm, Patient Position: Sitting)   Ht 5\' 2"  (1.575 m)   Wt 170 lb (77.1 kg)   BMI 31.09 kg/m?  ?Physical Exam ?Gen: NAD, alert, cooperative with exam, well-appearing ?MSK:  ?Neurovascularly intact   ? ? ? ? ?ASSESSMENT & PLAN:  ? ?Trochanteric bursitis of right hip ?Doing well since the injection.  Pain is almost resolved. ?-Counseled on home exercise therapy and supportive care. ?-Could consider shockwave therapy going forward. ? ? ? ? ?

## 2022-03-20 NOTE — Patient Instructions (Signed)
Good to see you ?Please continue the exercises   ?We could start shockwave therapy in the future at any point ?Please send me a message in MyChart with any questions or updates.  ?Please see me back as needed .  ? ?--Dr. Jordan Likes ? ?

## 2022-04-23 ENCOUNTER — Encounter: Payer: Self-pay | Admitting: Cardiology

## 2022-04-28 DIAGNOSIS — E785 Hyperlipidemia, unspecified: Secondary | ICD-10-CM | POA: Diagnosis not present

## 2022-04-28 DIAGNOSIS — I2510 Atherosclerotic heart disease of native coronary artery without angina pectoris: Secondary | ICD-10-CM | POA: Diagnosis not present

## 2022-04-28 DIAGNOSIS — Z9861 Coronary angioplasty status: Secondary | ICD-10-CM | POA: Diagnosis not present

## 2022-04-28 DIAGNOSIS — I10 Essential (primary) hypertension: Secondary | ICD-10-CM | POA: Diagnosis not present

## 2022-04-28 LAB — LIPID PANEL
Chol/HDL Ratio: 2 ratio (ref 0.0–4.4)
Cholesterol, Total: 139 mg/dL (ref 100–199)
HDL: 68 mg/dL (ref >39)
LDL Chol Calc (NIH): 60 mg/dL (ref 0–99)
Triglycerides: 51 mg/dL (ref 0–149)
VLDL Cholesterol Cal: 11 mg/dL (ref 5–40)

## 2022-04-28 LAB — HEPATIC FUNCTION PANEL
ALT: 18 IU/L (ref 0–32)
AST: 18 IU/L (ref 0–40)
Albumin: 4.4 g/dL (ref 3.8–4.8)
Alkaline Phosphatase: 92 IU/L (ref 44–121)
Bilirubin Total: 0.5 mg/dL (ref 0.0–1.2)
Bilirubin, Direct: 0.16 mg/dL (ref 0.00–0.40)
Total Protein: 6.7 g/dL (ref 6.0–8.5)

## 2022-04-30 ENCOUNTER — Encounter: Payer: Self-pay | Admitting: Cardiology

## 2022-04-30 ENCOUNTER — Ambulatory Visit (INDEPENDENT_AMBULATORY_CARE_PROVIDER_SITE_OTHER): Payer: BC Managed Care – PPO | Admitting: Cardiology

## 2022-04-30 VITALS — BP 118/68 | HR 65 | Ht 62.0 in | Wt 165.4 lb

## 2022-04-30 DIAGNOSIS — I10 Essential (primary) hypertension: Secondary | ICD-10-CM

## 2022-04-30 DIAGNOSIS — I2129 ST elevation (STEMI) myocardial infarction involving other sites: Secondary | ICD-10-CM

## 2022-04-30 DIAGNOSIS — E785 Hyperlipidemia, unspecified: Secondary | ICD-10-CM

## 2022-04-30 DIAGNOSIS — I2510 Atherosclerotic heart disease of native coronary artery without angina pectoris: Secondary | ICD-10-CM

## 2022-04-30 DIAGNOSIS — M7061 Trochanteric bursitis, right hip: Secondary | ICD-10-CM

## 2022-04-30 DIAGNOSIS — Z9861 Coronary angioplasty status: Secondary | ICD-10-CM

## 2022-04-30 NOTE — Progress Notes (Signed)
Primary Care Provider: Ursula Beath, MD [facility] Cardiologist: Bryan Lemma, MD Electrophysiologist: None  Clinic Note: Chief Complaint  Patient presents with   Follow-up    31-month.  Feels well now.   Coronary Artery Disease    Doing well.  No symptoms.   ===================================  ASSESSMENT/PLAN   Problem List Items Addressed This Visit       Cardiology Problems   ST elevation myocardial infarction (STEMI) of lateral wall (HCC) - Primary (Chronic)    Just over 18 months from her lateral STEMI.  Thankfully her EF was preserved with no wall motion normalities on echo.  She has had some intermittent episodes of chest discomfort because she is hyperaware of her symptoms, but not really having any true anginal symptoms.  Doing fairly well now.  Very active.  On max tolerated regimen with low-dose beta-blocker, high-dose statin, maintenance dose Brilinta.      Relevant Orders   Lipid panel   Comprehensive metabolic panel   Essential hypertension (Chronic)    Truthfully not hypertensive.  She is only on low-dose carvedilol for cardiovascular benefit.      Relevant Orders   Lipid panel   Comprehensive metabolic panel   Hyperlipidemia with target LDL less than 70 (Chronic)    Recent labs look outstanding with an LDL of 60 on 80 mg Lipitor. No myalgias, if she were to have issues with myalgias, would consider switching to rosuvastatin 40 mg.  But for now no change.      Relevant Orders   Lipid panel   Comprehensive metabolic panel   CAD S/P PCI of 100% ost-prox RI (Chronic)    18 months out from STEMI with RI PCI.  Remains on maintenance dose Brilinta 60 mg twice daily which can be held for procedures.  She is on high-dose statin and low-dose carvedilol-recently reduced to 3.25 mg twice daily. Continue maintenance dose Brilinta until at least November 2023 at which time we can go back to 81 mg aspirin. =>  Brilinta can be held 5 days preop for  surgeries or procedures.      Relevant Orders   Lipid panel   Comprehensive metabolic panel     Other   Trochanteric bursitis of right hip    She has had injections, but has not had no Brilinta, if necessary would be okay to hold.  We will also try to avoid standing dose of NSAID, but okay to take short course.      ===================================  HPI:    Renee Fitzgerald is a 46 y.o. female new with PMH notable for CAD-lateral STEMI (November 2021-DES PCI ostial RI), along with HTN and HLD who is being seen today for the evaluation of annual follow-up at the request of Ursula Beath, MD.  I last saw Renee Fitzgerald in April 2022: Doing well.  Graduated from cardiac rehab, during the gym.  Exercise tolerance/energy level was doing better.  Strength improving.  Back to riding horses but being careful.  Mild fatigue with mild exertional dyspnea.  Some mild weight loss.  Renee Fitzgerald was last seen on 09/11/2021 by Edd Fabian, NP: Feeling well.  Physically active exercising on treadmill or stationary bike 4-5 times a week.  Also rides horses. => She had stopped taking Imdur and used 1 dose of nitroglycerin because of chest discomfort several months before this. -> reduced Brilinta to 60 mg BID & d/c ASA  Recent Hospitalizations:  ER visit 03/26/2021 with chest pain ER visit 10/30/2021 chest  pain -> ruled out for MI.  Chest x-ray showed possible left-sided pneumonia.  Noted more coughing that was causing the chest wall pain.  Was treated with antibiotics as well as a muscle relaxant for chest wall pain.  Reviewed  CV studies:    The following studies were reviewed today: (if available, images/films reviewed: From Epic Chart or Care Everywhere) No new studies:  Interval History:   Renee Fitzgerald returns here today for follow-up stating that she is doing great.  No major cardiac issues.  She has had a couple intermittent episodes of chest pain here and there but nothing to make  her think she had a heart attack.  Exercise has been limited somewhat because of right hip bursitis and that slowed her up little bit.  She is finally now starting to get back active. Unfortunately no sooner than she start getting active than she had to because they could take care of her father who just had a stroke.  This is been quite a bit of stress for her. She is still try to exercise and stand up that she had been doing, and is feeling fine with no active cardiac symptoms.  Migraine headaches are also seem to be pretty well controlled since starting Ubrelvy.  Otherwise pretty normal CV Review of Symptoms (Summary) Cardiovascular ROS: no chest pain or dyspnea on exertion negative for - chest pain, dyspnea on exertion, edema, irregular heartbeat, orthopnea, palpitations, paroxysmal nocturnal dyspnea, rapid heart rate, shortness of breath, or syncope/near syncope, TIA/amaurosis fugax, claudication  REVIEWED OF SYSTEMS   Review of Systems  Constitutional:  Positive for weight loss (5 pounds since last visit.). Negative for malaise/fatigue.  HENT:  Negative for nosebleeds.   Respiratory:  Negative for cough and shortness of breath.   Cardiovascular:        Per HPI  Gastrointestinal:  Negative for blood in stool and melena.  Genitourinary:  Negative for hematuria.  Musculoskeletal:  Positive for joint pain (Right hip bursitis).  Neurological:  Positive for dizziness (Rarely, usually if she is not drinking enough.). Negative for focal weakness and headaches (Headaches pretty well controlled).  Psychiatric/Behavioral:  Negative for depression and memory loss. The patient is not nervous/anxious (She isAnxiety seems to have worn off is now a bit more stressed and concerned about her father who just had a stroke.) and does not have insomnia.     I have reviewed and (if needed) personally updated the patient's problem list, medications, allergies, past medical and surgical history, social and  family history.   PAST MEDICAL HISTORY   Past Medical History:  Diagnosis Date   Anemia    CAD S/P PCI of 100% ost-prox RI 09/29/2020   Lateral STEMI -> 100% ost-prox Diag - Resolute Onyx DES 2.0 x 15 mm - 2.3 mm   Essential hypertension 10/01/2020   Hyperlipidemia with target LDL less than 70 10/01/2020   Migraine    ST elevation myocardial infarction (STEMI) of lateral wall ([place]) 09/29/2020   RI 100% - DES PCI.     PAST SURGICAL HISTORY   Past Surgical History:  Procedure Laterality Date   CORONARY STENT INTERVENTION N/A 09/29/2020   Procedure: CORONARY STENT INTERVENTION;  Surgeon: Leonie Man, MD;  Location: [place] [facility];  Service: Cardiovascular:  Culprit Lesion -> ostial RI 100% (DES PCI-Resolute Onyx 2.0 mm x 15 mm--2.3 mm).   CORONARY/GRAFT ACUTE MI REVASCULARIZATION N/A 09/29/2020   Procedure: Coronary/Graft Acute MI Revascularization;  Surgeon: Leonie Man,  MD;  Location: [place] [facility];  Service: Cardiovascular;: Insetting of lateral STEMI: PTCA-DES PCI of culprit lesion-ostial RI 100% thrombotic occlusion   LEFT HEART CATH AND CORONARY ANGIOGRAPHY N/A 09/29/2020   Procedure: LEFT HEART CATH AND CORONARY ANGIOGRAPHY;  Surgeon: Leonie Man, MD;  Location: [place] [facility];  Service: Cardiovascular: (LATERAL STEMI) \: Culprit Lesion -> ostial RI 100% (DES PCI).  Distal LAD 30% RCA and LCx.  EF 55 to 60% anterolateral HK.   TRANSTHORACIC ECHOCARDIOGRAM  09/30/2020   Lateral STEMI:  EF 60-65%.  No R WMA.  Normal diastolic pressures.  Normal valves.   WISDOM TOOTH EXTRACTION     Cardiac Cath-PCI 09/29/2020: Culprit Lesion -> ostial RI 100% (DES PCI-B Resolute Onyx 2.0 mm x 15 mm--2.3 mm).  Distal LAD 30% RCA and LCx.  EF 55 to 60% AntLat HK.          There is no immunization history on file for this patient.  MEDICATIONS/ALLERGIES   Current Meds  Medication Sig   ALPRAZolam (XANAX) 0.25 MG tablet Take 0.25 mg by mouth daily as needed for  anxiety.    atorvastatin (LIPITOR) 80 MG tablet TAKE 0.5 TABLETS BY MOUTH DAILY.   carvedilol (COREG) 3.125 MG tablet TAKE 1 TABLET BY MOUTH TWICE A DAY WITH A MEAL   HYDROcodone-acetaminophen (NORCO/VICODIN) 5-325 MG tablet Take 1 tablet by mouth every 8 (eight) hours as needed.   Melatonin 10 MG TABS Take 10 mg by mouth at bedtime.   methocarbamol (ROBAXIN) 750 MG tablet Take by mouth.   Multiple Vitamin (MULTIVITAMIN WITH MINERALS) TABS tablet Take 1 tablet by mouth once a week.   nitroGLYCERIN (NITROSTAT) 0.4 MG SL tablet PLACE 1 TABLET UNDER THE TONGUE EVERY 5 MINUTES AS NEEDED.   ondansetron (ZOFRAN) 4 MG tablet Take 1 tablet (4 mg total) by mouth every 8 (eight) hours as needed for nausea or vomiting.   promethazine (PHENERGAN) 12.5 MG tablet Take by mouth.   promethazine (PHENERGAN) 25 MG tablet Take 25 mg by mouth daily as needed for nausea or vomiting.    ticagrelor (BRILINTA) 60 MG TABS tablet Take 1 tablet (60 mg total) by mouth 2 (two) times daily.   Ubrogepant (UBRELVY) 100 MG TABS Take by mouth.   zolpidem (AMBIEN) 10 MG tablet Take 10 mg by mouth at bedtime as needed for sleep.    Allergies  Allergen Reactions   Penicillins Rash    SOCIAL HISTORY/FAMILY HISTORY   Reviewed in Epic:   Social History   Tobacco Use   Smoking status: Never   Smokeless tobacco: Never  Substance Use Topics   Alcohol use: No   Drug use: No   Social History   Social History Narrative   She continues to be physically active exercising on her treadmill or stationary bike 4-5 times per week.  She also continues to ride her horses.  She has a Loss adjuster, chartered and an [place]   Family History  Problem Relation Age of Onset   Healthy Mother    Healthy Father    Heart attack Paternal Grandfather 63   CAD Paternal Grandfather 30    OBJCTIVE -PE, EKG, labs   Wt Readings from Last 3 Encounters:  04/30/22 165 lb 6.4 oz (75 kg)  03/20/22 170 lb (77.1 kg)  03/06/22 170 lb (77.1 kg)     Physical Exam: BP 118/68   Pulse 65   Ht 5\' 2"  (1.575 m)   Wt 165 lb 6.4 oz (75  kg)   SpO2 99%   BMI 30.25 kg/m  Physical Exam Vitals reviewed.  Constitutional:      General: She is not in acute distress.    Appearance: Normal appearance. She is obese. She is not ill-appearing or toxic-appearing.     Comments: Well-nourished, well-groomed.  HENT:     Head: Normocephalic and atraumatic.  Eyes:     Extraocular Movements: Extraocular movements intact.     Pupils: Pupils are equal, round, and reactive to light.  Neck:     Vascular: No carotid bruit or JVD.  Cardiovascular:     Rate and Rhythm: Normal rate and regular rhythm. No extrasystoles are present.    Chest Wall: PMI is not displaced.     Pulses: Normal pulses.     Heart sounds: Normal heart sounds, S1 normal and S2 normal. No murmur heard.    No friction rub. No gallop.  Pulmonary:     Effort: Pulmonary effort is normal. No respiratory distress.     Breath sounds: Normal breath sounds. No wheezing, rhonchi or rales.  Chest:     Chest wall: No tenderness.  Abdominal:     General: Abdomen is flat. Bowel sounds are normal. There is no distension.     Palpations: Abdomen is soft. There is no mass (No HSM or bruit).     Tenderness: There is no abdominal tenderness. There is no guarding or rebound.  Musculoskeletal:     Cervical back: Normal range of motion and neck supple.  Skin:    General: Skin is warm and dry.     Findings: No rash.  Neurological:     General: No focal deficit present.     Mental Status: She is alert and oriented to person, place, and time.     Gait: Gait abnormal (Favoring her right hip).  Psychiatric:        Mood and Affect: Mood normal.        Behavior: Behavior normal.        Thought Content: Thought content normal.        Judgment: Judgment normal.     Adult ECG Report Not checked  Recent Labs: Just checked prior to this visit-reviewed Lab Results  Component Value Date   CHOL 139  04/28/2022   HDL 68 04/28/2022   LDLCALC 60 04/28/2022   TRIG 51 04/28/2022   CHOLHDL 2.0 04/28/2022   Lab Results  Component Value Date   CREATININE 0.73 10/30/2021   BUN 9 10/30/2021   NA 137 10/30/2021   K 3.6 10/30/2021   CL 106 10/30/2021   CO2 22 10/30/2021      Latest Ref Rng & Units 10/30/2021    7:26 PM 10/10/2020   10:14 AM 10/05/2020    2:04 PM  CBC  WBC 4.0 - 10.5 K/uL 7.9  7.2  6.0   Hemoglobin 12.0 - 15.0 g/dL 11.3  11.3  12.0   Hematocrit 36.0 - 46.0 % 34.7  33.9  36.4   Platelets 150 - 400 K/uL 269  272  292     Lab Results  Component Value Date   HGBA1C 5.4 09/30/2020   No results found for: "TSH"  ==================================================  COVID-19 Education: The signs and symptoms of COVID-19 were discussed with the patient and how to seek care for testing (follow up with PCP or arrange E-visit).    I spent a total of 22  minutes with the patient spent in direct patient consultation.  Additional time spent  with chart review  / charting (studies, outside notes, etc): 12 min Total Time: 34 min  Current medicines are reviewed at length with the patient today.  (+/- concerns) none  This visit occurred during the SARS-CoV-2 public health emergency.  Safety protocols were in place, including screening questions prior to the visit, additional usage of staff PPE, and extensive cleaning of exam room while observing appropriate contact time as indicated for disinfecting solutions.  Notice: This dictation was prepared with Dragon dictation along with smart phrase technology. Any transcriptional errors that result from this process are unintentional and may not be corrected upon review.   Studies Ordered:  Orders Placed This Encounter  Procedures   Lipid panel   Comprehensive metabolic panel   No orders of the defined types were placed in this encounter.   Patient Instructions / Medication Changes & Studies & Tests Ordered   Patient  Instructions  Medication Instructions:   No changes   *If you need a refill on your cardiac medications before your next appointment, please call your pharmacy*   Lab Work: in Nov/Dec 2023 Lipid cmp If you have labs (blood work) drawn today and your tests are completely normal, you will receive your results only by: MyChart Message (if you have MyChart) OR A paper copy in the mail If you have any lab test that is abnormal or we need to change your treatment, we will call you to review the results.   Testing/Procedures: Not needed   Follow-Up: At [facility], you and your health needs are our priority.  As part of our continuing mission to provide you with exceptional heart care, we have created designated Provider Care Teams.  These Care Teams include your primary Cardiologist (physician) and Advanced Practice Providers (APPs -  Physician Assistants and Nurse Practitioners) who all work together to provide you with the care you need, when you need it.     Your next appointment:   6 month(s)  The format for your next appointment:   In Person  Provider:   Bryan Lemma, MD      Bryan Lemma, M.D., M.S. Interventional Cardiologist   Pager # [phone] Phone # [phone] [address] [place], [place] [address]   Thank you for choosing Heartcare at [facility]!!

## 2022-04-30 NOTE — Patient Instructions (Addendum)
Medication Instructions:   No changes   *If you need a refill on your cardiac medications before your next appointment, please call your pharmacy*   Lab Work: in Nov/Dec 2023 Lipid cmp If you have labs (blood work) drawn today and your tests are completely normal, you will receive your results only by: MyChart Message (if you have MyChart) OR A paper copy in the mail If you have any lab test that is abnormal or we need to change your treatment, we will call you to review the results.   Testing/Procedures: Not needed   Follow-Up: At [facility], you and your health needs are our priority.  As part of our continuing mission to provide you with exceptional heart care, we have created designated Provider Care Teams.  These Care Teams include your primary Cardiologist (physician) and Advanced Practice Providers (APPs -  Physician Assistants and Nurse Practitioners) who all work together to provide you with the care you need, when you need it.     Your next appointment:   6 month(s)  The format for your next appointment:   In Person  Provider:   Bryan Lemma, MD

## 2022-05-21 ENCOUNTER — Telehealth: Payer: Self-pay | Admitting: Cardiology

## 2022-05-21 NOTE — Telephone Encounter (Signed)
Spoke with patient of Dr. Herbie Baltimore - h/o CAD s/p stent. She reports for about a week she has had chest discomfort, describes as a heaviness, something sitting on her chest. Explained that I had previously scheduled her a visit with Luther Parody NP but that she should go on to [facility] for eval. She did not wish to do this, asked to keep appointment on the schedule, and would go to ED if symptoms worsened - she states she has not had good experience at previous ED, which was [facility]. Stressed importance of urgent eval given symptoms and history  Routed to MD/NP as Lorain Childes

## 2022-05-21 NOTE — Telephone Encounter (Signed)
Left message to call back  Scheduled for visit on 7/13 with Luther Parody NP at [facility] message sent

## 2022-05-21 NOTE — Telephone Encounter (Signed)
  Per MyChart scheduling message:  Pt c/o of Chest Pain: STAT if CP now or developed within 24 hours  1. Are you having CP right now?   2. Are you experiencing any other symptoms (ex. SOB, nausea, vomiting, sweating)?   3. How long have you been experiencing CP?   4. Is your CP continuous or coming and going?   5. Have you taken Nitroglycerin?  ? I am experiencing discomfort now. Been off and on since last Thursday. No other symptoms such as SOB, nausea, vomiting or sweating. I have not taken a nitro. I have very low energy, not sleeping well and ultra-sensitive to this heat.

## 2022-05-21 NOTE — Telephone Encounter (Signed)
Pt returning nurses call. Call transferred 

## 2022-05-21 NOTE — Telephone Encounter (Signed)
Will address at clinic visit.   Myosha Cuadras S Conlee Sliter, NP  

## 2022-05-22 ENCOUNTER — Ambulatory Visit (HOSPITAL_BASED_OUTPATIENT_CLINIC_OR_DEPARTMENT_OTHER): Payer: BC Managed Care – PPO | Admitting: Family

## 2022-05-22 ENCOUNTER — Encounter (HOSPITAL_BASED_OUTPATIENT_CLINIC_OR_DEPARTMENT_OTHER): Payer: Self-pay

## 2022-05-25 ENCOUNTER — Encounter: Payer: Self-pay | Admitting: Cardiology

## 2022-05-25 NOTE — Assessment & Plan Note (Signed)
Truthfully not hypertensive.  She is only on low-dose carvedilol for cardiovascular benefit.

## 2022-05-25 NOTE — Assessment & Plan Note (Signed)
Recent labs look outstanding with an LDL of 60 on 80 mg Lipitor. No myalgias, if she were to have issues with myalgias, would consider switching to rosuvastatin 40 mg.  But for now no change.

## 2022-05-25 NOTE — Assessment & Plan Note (Signed)
18 months out from STEMI with RI PCI.  Remains on maintenance dose Brilinta 60 mg twice daily which can be held for procedures.   She is on high-dose statin and low-dose carvedilol-recently reduced to 3.25 mg twice daily.  Continue maintenance dose Brilinta until at least November 2023 at which time we can go back to 81 mg aspirin. =>   Brilinta can be held 5 days preop for surgeries or procedures.

## 2022-05-25 NOTE — Assessment & Plan Note (Signed)
Just over 18 months from her lateral STEMI.  Thankfully her EF was preserved with no wall motion normalities on echo.  She has had some intermittent episodes of chest discomfort because she is hyperaware of her symptoms, but not really having any true anginal symptoms.  Doing fairly well now.  Very active.  On max tolerated regimen with low-dose beta-blocker, high-dose statin, maintenance dose Brilinta.

## 2022-05-25 NOTE — Assessment & Plan Note (Signed)
She has had injections, but has not had no Brilinta, if necessary would be okay to hold.  We will also try to avoid standing dose of NSAID, but okay to take short course.

## 2022-05-30 ENCOUNTER — Other Ambulatory Visit: Payer: Self-pay | Admitting: Cardiology

## 2022-05-31 ENCOUNTER — Emergency Department (HOSPITAL_COMMUNITY)
Admission: EM | Admit: 2022-05-31 | Discharge: 2022-05-31 | Disposition: A | Discharge status: Home / Self Care | Payer: BC Managed Care – PPO | Attending: Emergency Medicine | Admitting: Emergency Medicine

## 2022-05-31 ENCOUNTER — Emergency Department (HOSPITAL_COMMUNITY): Payer: BC Managed Care – PPO

## 2022-05-31 ENCOUNTER — Encounter (HOSPITAL_COMMUNITY): Payer: Self-pay | Admitting: Emergency Medicine

## 2022-05-31 ENCOUNTER — Other Ambulatory Visit: Payer: Self-pay

## 2022-05-31 DIAGNOSIS — J9811 Atelectasis: Secondary | ICD-10-CM | POA: Diagnosis not present

## 2022-05-31 DIAGNOSIS — Z5321 Procedure and treatment not carried out due to patient leaving prior to being seen by health care provider: Secondary | ICD-10-CM | POA: Insufficient documentation

## 2022-05-31 DIAGNOSIS — R079 Chest pain, unspecified: Secondary | ICD-10-CM | POA: Insufficient documentation

## 2022-05-31 LAB — BASIC METABOLIC PANEL
Anion gap: 6 (ref 5–15)
BUN: 7 mg/dL (ref 6–20)
CO2: 24 mmol/L (ref 22–32)
Calcium: 9.2 mg/dL (ref 8.9–10.3)
Chloride: 107 mmol/L (ref 98–111)
Creatinine, Ser: 0.76 mg/dL (ref 0.44–1.00)
GFR, Estimated: >60 mL/min (ref >60)
Glucose, Bld: 107 mg/dL — ABNORMAL HIGH (ref 70–99)
Potassium: 3.9 mmol/L (ref 3.5–5.1)
Sodium: 137 mmol/L (ref 135–145)

## 2022-05-31 LAB — CBC
HCT: 34.9 % — ABNORMAL LOW (ref 36.0–46.0)
Hemoglobin: 11.1 g/dL — ABNORMAL LOW (ref 12.0–15.0)
MCH: 29 pg (ref 26.0–34.0)
MCHC: 31.8 g/dL (ref 30.0–36.0)
MCV: 91.1 fL (ref 80.0–100.0)
Platelets: 256 10*3/uL (ref 150–400)
RBC: 3.83 MIL/uL — ABNORMAL LOW (ref 3.87–5.11)
RDW: 14.3 % (ref 11.5–15.5)
WBC: 5.9 10*3/uL (ref 4.0–10.5)
nRBC: 0 % (ref 0.0–0.2)

## 2022-05-31 LAB — TROPONIN I (HIGH SENSITIVITY): Troponin I (High Sensitivity): 3 ng/L (ref <18)

## 2022-05-31 NOTE — ED Triage Notes (Signed)
Patient complains of sudden onset of sharp central chest pain that started at approximately 1015 this morning and has been intermittent since. Pain started at 9/10, now 3/10. Patient is alert, oriented, ambulatory, and in no apparent distress at this time.

## 2022-05-31 NOTE — ED Notes (Signed)
Pt no answer for Vsx2

## 2022-05-31 NOTE — ED Notes (Signed)
N/A for repeat trop

## 2022-07-11 ENCOUNTER — Other Ambulatory Visit: Payer: Self-pay | Admitting: Cardiology

## 2022-07-11 ENCOUNTER — Other Ambulatory Visit: Payer: Self-pay | Admitting: General Practice

## 2022-07-11 DIAGNOSIS — I259 Chronic ischemic heart disease, unspecified: Secondary | ICD-10-CM

## 2022-08-27 ENCOUNTER — Encounter: Payer: Self-pay | Admitting: Cardiology

## 2022-08-27 ENCOUNTER — Other Ambulatory Visit: Payer: Self-pay | Admitting: Cardiology

## 2022-08-27 MED ORDER — ATORVASTATIN CALCIUM 80 MG PO TABS: ORAL_TABLET | ORAL | 1 refills | Status: DC

## 2022-08-27 NOTE — Telephone Encounter (Signed)
Sure, that is okay to fill the Protonix.  Glenetta Hew, MD

## 2022-09-03 MED ORDER — PANTOPRAZOLE SODIUM 40 MG PO TBEC: 40.0000 mg | DELAYED_RELEASE_TABLET | Freq: Every day | ORAL | 1 refills | Status: DC

## 2022-09-17 DIAGNOSIS — L820 Inflamed seborrheic keratosis: Secondary | ICD-10-CM | POA: Diagnosis not present

## 2022-10-13 DIAGNOSIS — I2129 ST elevation (STEMI) myocardial infarction involving other sites: Secondary | ICD-10-CM | POA: Diagnosis not present

## 2022-10-13 DIAGNOSIS — I2510 Atherosclerotic heart disease of native coronary artery without angina pectoris: Secondary | ICD-10-CM | POA: Diagnosis not present

## 2022-10-13 DIAGNOSIS — E785 Hyperlipidemia, unspecified: Secondary | ICD-10-CM | POA: Diagnosis not present

## 2022-10-13 DIAGNOSIS — I10 Essential (primary) hypertension: Secondary | ICD-10-CM | POA: Diagnosis not present

## 2022-10-13 DIAGNOSIS — Z9861 Coronary angioplasty status: Secondary | ICD-10-CM | POA: Diagnosis not present

## 2022-10-14 LAB — COMPREHENSIVE METABOLIC PANEL
ALT: 14 IU/L (ref 0–32)
AST: 11 IU/L (ref 0–40)
Albumin/Globulin Ratio: 1.9 (ref 1.2–2.2)
Albumin: 4.4 g/dL (ref 3.9–4.9)
Alkaline Phosphatase: 85 IU/L (ref 44–121)
BUN/Creatinine Ratio: 14 (ref 9–23)
BUN: 11 mg/dL (ref 6–24)
Bilirubin Total: 0.3 mg/dL (ref 0.0–1.2)
CO2: 23 mmol/L (ref 20–29)
Calcium: 9.4 mg/dL (ref 8.7–10.2)
Chloride: 105 mmol/L (ref 96–106)
Creatinine, Ser: 0.78 mg/dL (ref 0.57–1.00)
Globulin, Total: 2.3 g/dL (ref 1.5–4.5)
Glucose: 94 mg/dL (ref 70–99)
Potassium: 4.5 mmol/L (ref 3.5–5.2)
Sodium: 139 mmol/L (ref 134–144)
Total Protein: 6.7 g/dL (ref 6.0–8.5)
eGFR: 94 mL/min/{1.73_m2} (ref >59)

## 2022-10-14 LAB — LIPID PANEL
Chol/HDL Ratio: 2 ratio (ref 0.0–4.4)
Cholesterol, Total: 143 mg/dL (ref 100–199)
HDL: 70 mg/dL (ref >39)
LDL Chol Calc (NIH): 63 mg/dL (ref 0–99)
Triglycerides: 44 mg/dL (ref 0–149)
VLDL Cholesterol Cal: 10 mg/dL (ref 5–40)

## 2022-10-17 ENCOUNTER — Ambulatory Visit: Payer: BC Managed Care – PPO | Attending: Cardiology | Admitting: Cardiology

## 2022-10-17 ENCOUNTER — Encounter: Payer: Self-pay | Admitting: Cardiology

## 2022-10-17 VITALS — BP 108/66 | HR 63 | Ht 62.0 in | Wt 170.4 lb

## 2022-10-17 DIAGNOSIS — Z9861 Coronary angioplasty status: Secondary | ICD-10-CM | POA: Diagnosis not present

## 2022-10-17 DIAGNOSIS — E785 Hyperlipidemia, unspecified: Secondary | ICD-10-CM | POA: Diagnosis not present

## 2022-10-17 DIAGNOSIS — I259 Chronic ischemic heart disease, unspecified: Secondary | ICD-10-CM | POA: Diagnosis not present

## 2022-10-17 DIAGNOSIS — I2510 Atherosclerotic heart disease of native coronary artery without angina pectoris: Secondary | ICD-10-CM | POA: Diagnosis not present

## 2022-10-17 MED ORDER — ASPIRIN 81 MG PO TBEC: 81.0000 mg | DELAYED_RELEASE_TABLET | Freq: Every day | ORAL | 3 refills | Status: AC

## 2022-10-17 MED ORDER — TICAGRELOR 60 MG PO TABS: 60.0000 mg | ORAL_TABLET | Freq: Two times a day (BID) | ORAL | 3 refills | Status: DC

## 2022-10-17 NOTE — Progress Notes (Signed)
Primary Care Provider: Ursula Beath, MD [facility] Cardiologist: Bryan Lemma, MD Electrophysiologist: None  Clinic Note: Chief Complaint  Patient presents with   Follow-up    6 months.  Doing well.   Coronary Artery Disease    No further angina.   ===================================  ASSESSMENT/PLAN   Problem List Items Addressed This Visit       Cardiology Problems   Hyperlipidemia with target LDL less than 70 (Chronic)    Labs done this week look great.  LDL still <70 at 63.  Continue current dose of atorvastatin.  Tolerating 40 mg well.      Relevant Medications   aspirin EC 81 MG tablet   CAD S/P PCI of 100% ost-prox RI - Primary (Chronic)    Pretty much full 2 years out from PCI-RI in the setting of lateral STEMI. No further anginal symptoms.  She says she has some chest pain with vigorous exertion which could potentially be related to microvascular disease but there is no other significant lesions noted.  Doing well on current dose of 40 mg atorvastatin.  Lipids look great. On low-dose carvedilol with well-controlled blood pressure.  No low blood pressure readings. She is on low-dose Brilinta 60 mg twice daily for maintenance-will continue through the completion of this follow-up at [facility] Brilinta convert to 81 mg aspirin going forward. Going forward, would be okay to hold aspirin preop for surgeries or procedures 5 to 7 days.      Relevant Medications   aspirin EC 81 MG tablet   Other Visit Diagnoses     Ischemic heart disease       Relevant Medications   aspirin EC 81 MG tablet   ticagrelor (BRILINTA) 60 MG TABS tablet       ===================================  HPI:    Renee Fitzgerald is a 46 y.o. female with a PMH notable for CAD-lateral STEMI (November 2021 with DES PCI of the ostial RI), HTN, & HLD who presents today for 38-month follow-up.  She is here today at the request of Ursula Beath, MD.  Renee Fitzgerald was last  seen on April 30, 2022 as a routine follow-up..  Intermittent chest pain.  Exercise limited by right hip bursitis.  Lost 5 pounds.  Occasionally getting dizzy if did not drink enough. OTW stable.  Labs ordered, No med changes.   Recent Hospitalizations: n/a  Reviewed  CV studies:    The following studies were reviewed today: (if available, images/films reviewed: From Epic Chart or Care Everywhere) None:  Interval History:   Renee Fitzgerald returns for 2-month follow-up doing remarkably well.  She is pretty much fully back to full activity levels.  She rides her horse pretty much several days of the week and walks daily.  She may get chest pain if she really overexerts herself.  She says she took an extra aspirin at 1 time but otherwise has been doing well.  She is having less dizziness.  Staying adequately hydrated.  Promotion stable Renee Fitzgerald standpoint with no real active cardiac symptoms.  CV Review of Symptoms (Summary): Cardiovascular ROS: positive for - only 1 episode of chest pain with vigorous exertion negative for - dyspnea on exertion, edema, irregular heartbeat, orthopnea, palpitations, paroxysmal nocturnal dyspnea, rapid heart rate, shortness of breath, or lightheadedness or dizziness, syncope/near syncope TIA/amaurosis fugax, claudication.  REVIEWED OF SYSTEMS   Review of Systems  Constitutional:  Negative for malaise/fatigue and weight loss.  HENT:  Negative for congestion and nosebleeds.  Respiratory:  Negative for cough and shortness of breath.   Cardiovascular:        Per HPI  Gastrointestinal:  Negative for blood in stool and melena.  Genitourinary:  Negative for hematuria.  Musculoskeletal:  Positive for joint pain (Left hip still bothering her some but better.).  Neurological:  Negative for dizziness (Doing a good job staying hydrated.) and weakness.  Psychiatric/Behavioral: Negative.      I have reviewed and (if needed) personally updated the patient's problem  list, medications, allergies, past medical and surgical history, social and family history.   PAST MEDICAL HISTORY   Past Medical History:  Diagnosis Date   Anemia    CAD S/P PCI of 100% ost-prox RI 09/29/2020   Lateral STEMI -> 100% ost-prox Diag - Resolute Onyx DES 2.0 x 15 mm - 2.3 mm   Essential hypertension 10/01/2020   Hyperlipidemia with target LDL less than 70 10/01/2020   Migraine    ST elevation myocardial infarction (STEMI) of lateral wall (HCC) 09/29/2020   RI 100% - DES PCI.     PAST SURGICAL HISTORY   Past Surgical History:  Procedure Laterality Date   CORONARY STENT INTERVENTION N/A 09/29/2020   Procedure: CORONARY STENT INTERVENTION;  Surgeon: Marykay LexHarding, Dejuan Elman W, MD;  [facility];  Service: Cardiovascular:  Culprit Lesion -> ostial RI 100% (DES PCI-Resolute Onyx 2.0 mm x 15 mm--2.3 mm).   CORONARY/GRAFT ACUTE MI REVASCULARIZATION N/A 09/29/2020   Procedure: Coronary/Graft Acute MI Revascularization;  Surgeon: Marykay LexHarding, English Craighead W, MD;  [facility];  Service: Cardiovascular;: Insetting of lateral STEMI: PTCA-DES PCI of culprit lesion-ostial RI 100% thrombotic occlusion   LEFT HEART CATH AND CORONARY ANGIOGRAPHY N/A 09/29/2020   Procedure: LEFT HEART CATH AND CORONARY ANGIOGRAPHY;  Surgeon: Marykay LexHarding, Jerzi Tigert W, MD;  [facility];  Service: Cardiovascular: (LATERAL STEMI) \: Culprit Lesion -> ostial RI 100% (DES PCI).  Distal LAD 30% RCA and LCx.  EF 55 to 60% anterolateral HK.   TRANSTHORACIC ECHOCARDIOGRAM  09/30/2020   Lateral STEMI:  EF 60-65%.  No R WMA.  Normal diastolic pressures.  Normal valves.   WISDOM TOOTH EXTRACTION     .Cardiac Cath-PCI 09/29/2020: Culprit Lesion -> ostial RI 100% (DES PCI-B Resolute Onyx 2.0 mm x 15 mm--2.3 mm).  Distal LAD 30% RCA and LCx.  EF 55 to 60% AntLat HK.      There is no immunization history on file for this patient.  MEDICATIONS/ALLERGIES   Current Meds  Medication Sig    ALPRAZolam (XANAX) 0.25 MG tablet Take 0.25 mg by mouth daily as needed for anxiety.    atorvastatin (LIPITOR) 80 MG tablet TAKE 1/2 TABLET BY MOUTH EVERY DAY   atorvastatin (LIPITOR) 80 MG tablet TAKE 0.5 TABLETS BY MOUTH DAILY. (Patient taking differently: Take 80 mg by mouth daily. TAKE 0.5 TABLETS BY MOUTH DAILY.)   BRILINTA 60 MG TABS tablet TAKE 1 TABLET BY MOUTH 2 TIMES DAILY.   carvedilol (COREG) 3.125 MG tablet TAKE 1 TABLET BY MOUTH TWICE A DAY WITH A MEAL   HYDROcodone-acetaminophen (NORCO/VICODIN) 5-325 MG tablet Take 1 tablet by mouth every 8 (eight) hours as needed.   Melatonin 10 MG TABS Take 10 mg by mouth at bedtime.   methocarbamol (ROBAXIN) 750 MG tablet Take by mouth.   Multiple Vitamin (MULTIVITAMIN WITH MINERALS) TABS tablet Take 1 tablet by mouth once a week.   nitroGLYCERIN (NITROSTAT) 0.4 MG SL tablet PLACE 1 TABLET UNDER THE TONGUE  EVERY 5 MINUTES AS NEEDED.   ondansetron (ZOFRAN) 4 MG tablet Take 1 tablet (4 mg total) by mouth every 8 (eight) hours as needed for nausea or vomiting.   pantoprazole (PROTONIX) 40 MG tablet Take 1 tablet (40 mg total) by mouth daily.   promethazine (PHENERGAN) 25 MG tablet Take 25 mg by mouth daily as needed for nausea or vomiting.    Ubrogepant (UBRELVY) 100 MG TABS Take by mouth.   zolpidem (AMBIEN) 10 MG tablet Take 10 mg by mouth at bedtime as needed for sleep.    Allergies  Allergen Reactions   Penicillins Rash    SOCIAL HISTORY/FAMILY HISTORY   Reviewed in Epic:  Pertinent findings:  Social History   Tobacco Use   Smoking status: Never   Smokeless tobacco: Never  Substance Use Topics   Alcohol use: No   Drug use: No   Social History   Social History Narrative   She continues to be physically active exercising on her treadmill or stationary bike 4-5 times per week.  She also continues to ride her horses.  She has a IT consultant and an [place]    OBJCTIVE -PE, EKG, labs   Wt Readings from Last 3 Encounters:   10/17/22 170 lb 6.4 oz (77.3 kg)  04/30/22 165 lb 6.4 oz (75 kg)  03/20/22 170 lb (77.1 kg)    Physical Exam: BP 108/66 (BP Location: Left Arm, Patient Position: Sitting, Cuff Size: Normal)   Pulse 63   Ht 5\' 2"  (1.575 m)   Wt 170 lb 6.4 oz (77.3 kg)   SpO2 99%   BMI 31.17 kg/m  Physical Exam Vitals reviewed.  Constitutional:      General: She is not in acute distress.    Appearance: Normal appearance. She is obese. She is not ill-appearing or toxic-appearing.  HENT:     Head: Normocephalic and atraumatic.  Neck:     Vascular: No carotid bruit or JVD.  Cardiovascular:     Rate and Rhythm: Normal rate and regular rhythm. No extrasystoles are present.    Chest Wall: PMI is not displaced.     Pulses: Normal pulses.     Heart sounds: S1 normal and S2 normal. No murmur heard.    No friction rub. No gallop.  Pulmonary:     Effort: Pulmonary effort is normal. No respiratory distress.     Breath sounds: No wheezing, rhonchi or rales.  Musculoskeletal:        General: No swelling. Normal range of motion.     Cervical back: Normal range of motion and neck supple.  Skin:    General: Skin is warm and dry.  Neurological:     General: No focal deficit present.     Mental Status: She is alert and oriented to person, place, and time.     Gait: Gait normal.  Psychiatric:        Mood and Affect: Mood normal.        Behavior: Behavior normal.        Thought Content: Thought content normal.        Judgment: Judgment normal.     Adult ECG Report N/a  Recent Labs:    Lab Results  Component Value Date   CHOL 143 10/13/2022   HDL 70 10/13/2022   LDLCALC 63 10/13/2022   TRIG 44 10/13/2022   CHOLHDL 2.0 10/13/2022   Lab Results  Component Value Date   CREATININE 0.78 10/13/2022   BUN 11 10/13/2022  NA 139 10/13/2022   K 4.5 10/13/2022   CL 105 10/13/2022   CO2 23 10/13/2022      Latest Ref Rng & Units 05/31/2022   10:58 AM 10/30/2021    7:26 PM 10/10/2020   10:14 AM   CBC  WBC 4.0 - 10.5 K/uL 5.9  7.9  7.2   Hemoglobin 12.0 - 15.0 g/dL 48.8  89.1  69.4   Hematocrit 36.0 - 46.0 % 34.9  34.7  33.9   Platelets 150 - 400 K/uL 256  269  272     Lab Results  Component Value Date   HGBA1C 5.4 09/30/2020   No results found for: "TSH"  ================================================== I spent a total of 28 minutes with the patient spent in direct patient consultation.  Additional time spent with chart review  / charting (studies, outside notes, etc): 13 min Total Time: 41 min  Current medicines are reviewed at length with the patient today.  (+/- concerns) n/a  Notice: This dictation was prepared with Dragon dictation along with smart phrase technology. Any transcriptional errors that result from this process are unintentional and may not be corrected upon review.  Studies Ordered:   No orders of the defined types were placed in this encounter.  Meds ordered this encounter  Medications   aspirin EC 81 MG tablet    Sig: Take 1 tablet (81 mg total) by mouth daily. Swallow whole. Start taking after you complete the Brilinta    Dispense:  90 tablet    Refill:  3   ticagrelor (BRILINTA) 60 MG TABS tablet    Sig: Take 1 tablet (60 mg total) by mouth 2 (two) times daily. Stop taking after the your current bottle    Dispense:  180 tablet    Refill:  3    Patient Instructions / Medication Changes & Studies & Tests Ordered   Patient Instructions  Medication Instructions:   Complete the Brilinta you are taking now, once you complete the bottle stop taking  - then start taking Aspirin 81 mg  daily   *If you need a refill on your cardiac medications before your next appointment, please call your pharmacy*   Lab Work:  Not needed  Testing/Procedures: Not needed   Follow-Up: At [facility], you and your health needs are our priority.  As part of our continuing mission to provide you with exceptional heart care, we have created designated  Provider Care Teams.  These Care Teams include your primary Cardiologist (physician) and Advanced Practice Providers (APPs -  Physician Assistants and Nurse Practitioners) who all work together to provide you with the care you need, when you need it.     Your next appointment:   12 month(s)  The format for your next appointment:   In Person  Provider:   Bryan Lemma, MD         Marykay Lex, MD, MS Bryan Lemma, M.D., M.S. Interventional Cardiologist  [facility]  Pager # [phone] Phone # [phone] [address] [place], [place] [address]   Thank you for choosing [facility] at [place]!!

## 2022-10-17 NOTE — Patient Instructions (Addendum)
Medication Instructions:   Complete the Brilinta you are taking now, once you complete the bottle stop taking  - then start taking Aspirin 81 mg  daily   *If you need a refill on your cardiac medications before your next appointment, please call your pharmacy*   Lab Work:  Not needed  Testing/Procedures: Not needed   Follow-Up: At [facility], you and your health needs are our priority.  As part of our continuing mission to provide you with exceptional heart care, we have created designated Provider Care Teams.  These Care Teams include your primary Cardiologist (physician) and Advanced Practice Providers (APPs -  Physician Assistants and Nurse Practitioners) who all work together to provide you with the care you need, when you need it.     Your next appointment:   12 month(s)  The format for your next appointment:   In Person  Provider:   Bryan Lemma, MD

## 2022-11-09 ENCOUNTER — Encounter: Payer: Self-pay | Admitting: Cardiology

## 2022-11-09 NOTE — Assessment & Plan Note (Signed)
Pretty much full 2 years out from PCI-RI in the setting of lateral STEMI. No further anginal symptoms.  She says she has some chest pain with vigorous exertion which could potentially be related to microvascular disease but there is no other significant lesions noted.  Doing well on current dose of 40 mg atorvastatin.  Lipids look great. On low-dose carvedilol with well-controlled blood pressure.  No low blood pressure readings. She is on low-dose Brilinta 60 mg twice daily for maintenance-will continue through the completion of this follow-up at [facility] Brilinta convert to 81 mg aspirin going forward. Going forward, would be okay to hold aspirin preop for surgeries or procedures 5 to 7 days.

## 2022-11-09 NOTE — Assessment & Plan Note (Signed)
Labs done this week look great.  LDL still <70 at 63.  Continue current dose of atorvastatin.  Tolerating 40 mg well.

## 2022-12-15 ENCOUNTER — Other Ambulatory Visit: Payer: Self-pay | Admitting: Nurse Practitioner

## 2022-12-15 ENCOUNTER — Ambulatory Visit
Admission: RE | Admit: 2022-12-15 | Discharge: 2022-12-15 | Disposition: A | Discharge status: Home / Self Care | Payer: BC Managed Care – PPO | Source: Ambulatory Visit | Attending: Nurse Practitioner | Admitting: Nurse Practitioner

## 2022-12-15 DIAGNOSIS — R059 Cough, unspecified: Secondary | ICD-10-CM

## 2022-12-18 DIAGNOSIS — G43109 Migraine with aura, not intractable, without status migrainosus: Secondary | ICD-10-CM | POA: Insufficient documentation

## 2023-01-18 ENCOUNTER — Other Ambulatory Visit: Payer: Self-pay | Admitting: Cardiology

## 2023-02-23 ENCOUNTER — Encounter: Payer: Self-pay | Admitting: *Deleted

## 2023-03-01 ENCOUNTER — Other Ambulatory Visit: Payer: Self-pay | Admitting: Cardiology

## 2023-03-02 NOTE — Telephone Encounter (Signed)
Rx request sent to pharmacy.  

## 2023-03-13 ENCOUNTER — Other Ambulatory Visit: Payer: Self-pay

## 2023-03-13 ENCOUNTER — Ambulatory Visit (HOSPITAL_BASED_OUTPATIENT_CLINIC_OR_DEPARTMENT_OTHER)
Admission: RE | Admit: 2023-03-13 | Discharge: 2023-03-13 | Disposition: A | Discharge status: Home / Self Care | Payer: BC Managed Care – PPO | Source: Ambulatory Visit | Attending: Family Medicine | Admitting: Family Medicine

## 2023-03-13 ENCOUNTER — Encounter: Payer: Self-pay | Admitting: Family Medicine

## 2023-03-13 ENCOUNTER — Ambulatory Visit: Payer: BC Managed Care – PPO | Admitting: Family Medicine

## 2023-03-13 VITALS — BP 100/70 | Ht 62.0 in | Wt 165.0 lb

## 2023-03-13 DIAGNOSIS — M25552 Pain in left hip: Secondary | ICD-10-CM

## 2023-03-13 DIAGNOSIS — M25551 Pain in right hip: Secondary | ICD-10-CM | POA: Insufficient documentation

## 2023-03-13 MED ORDER — METHYLPREDNISOLONE ACETATE 40 MG/ML IJ SUSP
40.0000 mg | Freq: Once | INTRAMUSCULAR | Status: AC
  Administered 2023-03-13: 40 mg via INTRA_ARTICULAR

## 2023-03-13 NOTE — Assessment & Plan Note (Signed)
Acutely occurring with pain over the lateral aspect that is exacerbated with walking. -Counseled on home exercise therapy and supportive care. -Bilateral injections today. -Bilateral hip x-rays. -Referral to physical therapy.

## 2023-03-13 NOTE — Progress Notes (Signed)
Renee Fitzgerald - 47 y.o. female MRN [id number]  Date of birth: [birth year]  SUBJECTIVE:  Including CC & ROS.  No chief complaint on file.   Renee Fitzgerald is a 47 y.o. female that is presenting with acute worsening of bilateral hip pain.  The pain is occurring on the lateral aspect.  It is worse with walking.  No injury.  No history of surgery.    Review of Systems See HPI   HISTORY: Past Medical, Surgical, Social, and Family History Reviewed & Updated per EMR.   Pertinent Historical Findings include:  Past Medical History:  Diagnosis Date   Anemia    CAD S/P PCI of 100% ost-prox RI 09/29/2020   Lateral STEMI -> 100% ost-prox Diag - Resolute Onyx DES 2.0 x 15 mm - 2.3 mm   Essential hypertension 10/01/2020   Hyperlipidemia with target LDL less than 70 10/01/2020   Migraine    ST elevation myocardial infarction (STEMI) of lateral wall (HCC) 09/29/2020   RI 100% - DES PCI.     Past Surgical History:  Procedure Laterality Date   CORONARY STENT INTERVENTION N/A 09/29/2020   Procedure: CORONARY STENT INTERVENTION;  Surgeon: Marykay Lex, MD;  Location: [facility] INVASIVE CV LAB;  Service: Cardiovascular:  Culprit Lesion -> ostial RI 100% (DES PCI-Resolute Onyx 2.0 mm x 15 mm--2.3 mm).   CORONARY/GRAFT ACUTE MI REVASCULARIZATION N/A 09/29/2020   Procedure: Coronary/Graft Acute MI Revascularization;  Surgeon: Marykay Lex, MD;  Location[facility] INVASIVE CV LAB;  Service: Cardiovascular;: Insetting of lateral STEMI: PTCA-DES PCI of culprit lesion-ostial RI 100% thrombotic occlusion   LEFT HEART CATH AND CORONARY ANGIOGRAPHY N/A 09/29/2020   Procedure: LEFT HEART CATH AND CORONARY ANGIOGRAPHY;  Surgeon: Marykay Lex, MD;  Location: [facility] INVASIVE CV LAB;  Service: Cardiovascular: (LATERAL STEMI) \: Culprit Lesion -> ostial RI 100% (DES PCI).  Distal LAD 30% RCA and LCx.  EF 55 to 60% anterolateral HK.   TRANSTHORACIC ECHOCARDIOGRAM  09/30/2020   Lateral STEMI:  EF 60-65%.  No R WMA.   Normal diastolic pressures.  Normal valves.   WISDOM TOOTH EXTRACTION       PHYSICAL EXAM:  VS: BP 100/70 (BP Location: Left Arm, Patient Position: Sitting)   Ht 5\' 2"  (1.575 m)   Wt 165 lb (74.8 kg)   LMP 03/06/2023   BMI 30.18 kg/m  Physical Exam Gen: NAD, alert, cooperative with exam, well-appearing MSK:  Neurovascularly intact     Aspiration/Injection Procedure Note Renee Fitzgerald [birth year]  Procedure: Injection Indications: Right hip pain  Procedure Details Consent: Risks of procedure as well as the alternatives and risks of each were explained to the (patient/caregiver).  Consent for procedure obtained. Time Out: Verified patient identification, verified procedure, site/side was marked, verified correct patient position, special equipment/implants available, medications/allergies/relevent history reviewed, required imaging and test results available.  Performed.  The area was cleaned with iodine and alcohol swabs.    The right trochanteric bursa was injected using 1 cc's of 40 mg Depo-Medrol and 4 cc's of 0.25% bupivacaine was injected on a 22-gauge 3-1/2 inch needle.  Ultrasound was used. Images were obtained in long views showing the injection.     A sterile dressing was applied.  Patient did tolerate procedure well.  Aspiration/Injection Procedure Note Renee Fitzgerald December 03, 1975  Procedure: Injection Indications: Left hip pain  Procedure Details Consent: Risks of procedure as well as the alternatives and risks of each were explained to the (patient/caregiver).  Consent for procedure  obtained. Time Out: Verified patient identification, verified procedure, site/side was marked, verified correct patient position, special equipment/implants available, medications/allergies/relevent history reviewed, required imaging and test results available.  Performed.  The area was cleaned with iodine and alcohol swabs.    The left trochanteric bursa was injected using 1 cc's  of 40 mg Depo-Medrol and 4 cc's of 0.25% bupivacaine was injected on a 22-gauge 3-1/2 inch needle.  Ultrasound was used. Images were obtained in long views showing the injection.    A sterile dressing was applied.  Patient did tolerate procedure well.   ASSESSMENT & PLAN:   Greater trochanteric pain syndrome of both lower extremities Acutely occurring with pain over the lateral aspect that is exacerbated with walking. -Counseled on home exercise therapy and supportive care. -Bilateral injections today. -Bilateral hip x-rays. -Referral to physical therapy.

## 2023-03-13 NOTE — Addendum Note (Signed)
Addended by: Merrilyn Puma on: 03/13/2023 11:37 AM   Modules accepted: Orders

## 2023-03-13 NOTE — Patient Instructions (Signed)
Good to see you Please alternate heat and ice  We have placed a referral to physical therapy  We'll call with the results from today   Please send me a message in MyChart with any questions or updates.  Please see me back as needed.   --Dr. Jordan Likes

## 2023-03-17 ENCOUNTER — Encounter: Payer: Self-pay | Admitting: Family Medicine

## 2023-03-18 ENCOUNTER — Telehealth: Payer: Self-pay | Admitting: Family Medicine

## 2023-03-18 NOTE — Telephone Encounter (Signed)
Left VM for patient. If she calls back please have her speak with a nurse/CMA and inform that her xrays are normal.   If any questions then please take the best time and phone number to call and I will try to call her back.   Myra Rude, MD [facility] 03/18/2023, 2:14 PM

## 2023-06-11 ENCOUNTER — Other Ambulatory Visit: Payer: Self-pay

## 2023-06-11 ENCOUNTER — Ambulatory Visit
Admission: RE | Admit: 2023-06-11 | Discharge: 2023-06-11 | Disposition: A | Discharge status: Home / Self Care | Payer: BC Managed Care – PPO | Source: Ambulatory Visit | Attending: Family Medicine | Admitting: Family Medicine

## 2023-06-11 VITALS — BP 114/77 | HR 77 | Temp 98.6°F | Resp 16

## 2023-06-11 DIAGNOSIS — R110 Nausea: Secondary | ICD-10-CM

## 2023-06-11 DIAGNOSIS — U071 COVID-19: Secondary | ICD-10-CM

## 2023-06-11 DIAGNOSIS — R059 Cough, unspecified: Secondary | ICD-10-CM

## 2023-06-11 MED ORDER — BENZONATATE 200 MG PO CAPS: 200.0000 mg | ORAL_CAPSULE | Freq: Three times a day (TID) | ORAL | 0 refills | Status: AC | PRN

## 2023-06-11 MED ORDER — PAXLOVID (300/100) 20 X 150 MG & 10 X 100MG PO TBPK: 3.0000 | ORAL_TABLET | Freq: Two times a day (BID) | ORAL | 0 refills | Status: AC

## 2023-06-11 MED ORDER — ONDANSETRON HCL 4 MG/2ML IJ SOLN
8.0000 mg | Freq: Once | INTRAMUSCULAR | Status: AC
Start: 2023-06-11 — End: 2023-06-11
  Administered 2023-06-11: 8 mg via INTRAMUSCULAR

## 2023-06-11 MED ORDER — HYDROCODONE BIT-HOMATROP MBR 5-1.5 MG/5ML PO SOLN: 5.0000 mL | Freq: Four times a day (QID) | ORAL | 0 refills | Status: DC | PRN

## 2023-06-11 NOTE — Discharge Instructions (Addendum)
Advised patient to take medication as directed with food to completion.  Advised patient may take Tessalon Perles daily or as needed for cough.  Advised may use Hycodan at night for cough due to sedative effects.  Encouraged increase daily water intake to 64 ounces per day while taking these medications.  Advised if symptoms worsen and/or unresolved please follow-up with PCP or here for further evaluation.

## 2023-06-11 NOTE — ED Provider Notes (Signed)
[facility]    CSN: [id number] Arrival date & time: 06/11/23  1143      History   Chief Complaint Chief Complaint  Patient presents with   Nausea    COVID pos    HPI Renee Fitzgerald is a 47 y.o. female.   HPI 47 year old female presents with COVID-positive since Tuesday, 06/09/2023.  Patient reports nausea and vomiting for 3 days.  PMH significant for CAD (s/p STEMI), HTN, and migraine.  Patient is accompanied by her husband today.  Past Medical History:  Diagnosis Date   Anemia    CAD S/P PCI of 100% ost-prox RI 09/29/2020   Lateral STEMI -> 100% ost-prox Diag - Resolute Onyx DES 2.0 x 15 mm - 2.3 mm   Essential hypertension 10/01/2020   Hyperlipidemia with target LDL less than 70 10/01/2020   Migraine    ST elevation myocardial infarction (STEMI) of lateral wall (HCC) 09/29/2020   RI 100% - DES PCI.     Patient Active Problem List   Diagnosis Date Noted   Greater trochanteric pain syndrome of both lower extremities 03/06/2022   Lumbar radiculopathy 03/06/2022   Hyperlipidemia with target LDL less than 70 10/01/2020   Essential hypertension 10/01/2020   ST elevation myocardial infarction (STEMI) of lateral wall (HCC) 09/29/2020   CAD S/P PCI of 100% ost-prox RI 09/29/2020    Past Surgical History:  Procedure Laterality Date   CORONARY STENT INTERVENTION N/A 09/29/2020   Procedure: CORONARY STENT INTERVENTION;  Surgeon: Marykay Lex, MD;  Location: [facility] INVASIVE CV LAB;  Service: Cardiovascular:  Culprit Lesion -> ostial RI 100% (DES PCI-Resolute Onyx 2.0 mm x 15 mm--2.3 mm).   CORONARY/GRAFT ACUTE MI REVASCULARIZATION N/A 09/29/2020   Procedure: Coronary/Graft Acute MI Revascularization;  Surgeon: Marykay Lex, MD;  Location: [facility] INVASIVE CV LAB;  Service: Cardiovascular;: Insetting of lateral STEMI: PTCA-DES PCI of culprit lesion-ostial RI 100% thrombotic occlusion   LEFT HEART CATH AND CORONARY ANGIOGRAPHY N/A 09/29/2020   Procedure: LEFT HEART  CATH AND CORONARY ANGIOGRAPHY;  Surgeon: Marykay Lex, MD;  Location: [facility] INVASIVE CV LAB;  Service: Cardiovascular: (LATERAL STEMI) \: Culprit Lesion -> ostial RI 100% (DES PCI).  Distal LAD 30% RCA and LCx.  EF 55 to 60% anterolateral HK.   TRANSTHORACIC ECHOCARDIOGRAM  09/30/2020   Lateral STEMI:  EF 60-65%.  No R WMA.  Normal diastolic pressures.  Normal valves.   WISDOM TOOTH EXTRACTION      OB History   No obstetric history on file.      Home Medications    Prior to Admission medications   Medication Sig Start Date End Date Taking? Authorizing Provider  benzonatate (TESSALON) 200 MG capsule Take 1 capsule (200 mg total) by mouth 3 (three) times daily as needed for up to 7 days. 06/11/23 06/18/23 Yes Trevor Iha, FNP  HYDROcodone bit-homatropine (HYCODAN) 5-1.5 MG/5ML syrup Take 5 mLs by mouth every 6 (six) hours as needed for cough. 06/11/23  Yes Trevor Iha, FNP  nirmatrelvir & ritonavir (PAXLOVID, 300/100,) 20 x 150 MG & 10 x 100MG  TBPK Take 3 tablets by mouth 2 (two) times daily for 5 days. 06/11/23 06/16/23 Yes Trevor Iha, FNP  ALPRAZolam Prudy Feeler) 0.25 MG tablet Take 0.25 mg by mouth daily as needed for anxiety.  06/11/20   [provider]  aspirin EC 81 MG tablet Take 1 tablet (81 mg total) by mouth daily. Swallow whole. Start taking after you complete the Brilinta 10/17/22   Bryan Lemma  W, MD  atorvastatin (LIPITOR) 80 MG tablet TAKE 1/2 TABLET BY MOUTH EVERY DAY 08/27/22   Marykay Lex, MD  atorvastatin (LIPITOR) 80 MG tablet TAKE 1/2 TABLET BY MOUTH DAILY 03/02/23   Marykay Lex, MD  carvedilol (COREG) 3.125 MG tablet TAKE 1 TABLET BY MOUTH TWICE A DAY WITH A MEAL 07/11/22   Marykay Lex, MD  Melatonin 10 MG TABS Take 10 mg by mouth at bedtime.    [provider]  Multiple Vitamin (MULTIVITAMIN WITH MINERALS) TABS tablet Take 1 tablet by mouth once a week.    [provider]  nitroGLYCERIN (NITROSTAT) 0.4 MG SL tablet PLACE 1 TABLET UNDER  THE TONGUE EVERY 5 MINUTES AS NEEDED. 01/19/23   Marykay Lex, MD  ondansetron (ZOFRAN) 4 MG tablet Take 1 tablet (4 mg total) by mouth every 8 (eight) hours as needed for nausea or vomiting. 10/30/21   Tegeler, Canary Brim, MD  pantoprazole (PROTONIX) 40 MG tablet Take 1 tablet (40 mg total) by mouth daily. 09/03/22   Marykay Lex, MD  promethazine (PHENERGAN) 12.5 MG tablet Take by mouth. Patient not taking: Reported on 10/17/2022    [provider]  promethazine (PHENERGAN) 25 MG tablet Take 25 mg by mouth daily as needed for nausea or vomiting.  06/06/20   [provider]  Ubrogepant (UBRELVY) 100 MG TABS Take by mouth. 10/23/20   [provider]  zolpidem (AMBIEN) 10 MG tablet Take 10 mg by mouth at bedtime as needed for sleep. 09/10/20   [provider]    Family History Family History  Problem Relation Age of Onset   Healthy Mother    Healthy Father    Heart attack Paternal Grandfather 43   CAD Paternal Grandfather 30    Social History Social History   Tobacco Use   Smoking status: Never   Smokeless tobacco: Never  Substance Use Topics   Alcohol use: No   Drug use: No     Allergies   Penicillins   Review of Systems Review of Systems  HENT:  Positive for sore throat.   Gastrointestinal:  Positive for nausea and vomiting.  All other systems reviewed and are negative.    Physical Exam Triage Vital Signs ED Triage Vitals  Encounter Vitals Group     BP 06/11/23 1159 114/77     Systolic BP Percentile --      Diastolic BP Percentile --      Pulse Rate 06/11/23 1159 77     Resp 06/11/23 1159 16     Temp 06/11/23 1159 98.6 F (37 C)     Temp Source 06/11/23 1159 Oral     SpO2 06/11/23 1159 98 %     Weight --      Height --      Head Circumference --      Peak Flow --      Pain Score 06/11/23 1200 7     Pain Loc --      Pain Education --      Exclude from Growth Chart --    No data found.  Updated Vital  Signs BP 114/77 (BP Location: Left Arm)   Pulse 77   Temp 98.6 F (37 C) (Oral)   Resp 16   SpO2 98%    Physical Exam Vitals and nursing note reviewed.  Constitutional:      Appearance: Normal appearance. She is obese. She is ill-appearing.  HENT:     Head:  Normocephalic and atraumatic.     Right Ear: Tympanic membrane, ear canal and external ear normal.     Left Ear: Tympanic membrane, ear canal and external ear normal.     Mouth/Throat:     Mouth: Mucous membranes are moist.     Pharynx: Oropharynx is clear.  Eyes:     Extraocular Movements: Extraocular movements intact.     Conjunctiva/sclera: Conjunctivae normal.     Pupils: Pupils are equal, round, and reactive to light.  Cardiovascular:     Rate and Rhythm: Normal rate and regular rhythm.     Pulses: Normal pulses.     Heart sounds: Normal heart sounds.  Pulmonary:     Effort: Pulmonary effort is normal.     Breath sounds: Normal breath sounds.  Abdominal:     General: Bowel sounds are normal.  Musculoskeletal:        General: Normal range of motion.     Cervical back: Normal range of motion and neck supple.  Skin:    General: Skin is warm and dry.  Neurological:     General: No focal deficit present.     Mental Status: She is alert and oriented to person, place, and time. Mental status is at baseline.  Psychiatric:        Mood and Affect: Mood normal.        Behavior: Behavior normal.        Thought Content: Thought content normal.      UC Treatments / Results  Labs (all labs ordered are listed, but only abnormal results are displayed) Labs Reviewed - No data to display  EKG   Radiology No results found.  Procedures Procedures (including critical care time)  Medications Ordered in UC Medications  ondansetron (ZOFRAN) injection 8 mg (8 mg Intramuscular Given 06/11/23 1210)    Initial Impression / Assessment and Plan / UC Course  I have reviewed the triage vital signs and the nursing  notes.  Pertinent labs & imaging results that were available during my care of the patient were reviewed by me and considered in my medical decision making (see chart for details).     MDM; COVID-19-Rx'd Paxlovid 300/100, 20 x 150 mg & 10 x 100 mg TBPK: Take 3 tabs 2 times daily for the next 5 days; 2.  Nausea-Zofran IM 8 mg given once in clinic and prior to discharge; 3.  Cough, unspecified type Rx'd Hycodan 5-1.5 mg / 5 mL: Take 5 mL every 6 hours as needed for cough, Rx'd Tessalon pearls 200 mg 3 times daily, as needed. Advised patient to take medication as directed with food to completion.  Advised patient may take Tessalon Perles daily or as needed for cough.  Advised may use Hycodan at night for cough due to sedative effects.  Encouraged increase daily water intake to 64 ounces per day while taking these medications.  Advised if symptoms worsen and/or unresolved please follow-up with PCP or here for further evaluation.  Work note provided to patient prior to discharge today.  Patient discharged home, hemodynamically stable. Final Clinical Impressions(s) / UC Diagnoses   Final diagnoses:  Nausea  COVID-19  Cough, unspecified type     Discharge Instructions      Advised patient to take medication as directed with food to completion.  Advised patient may take Tessalon Perles daily or as needed for cough.  Advised may use Hycodan at night for cough due to sedative effects.  Encouraged increase daily water intake to  64 ounces per day while taking these medications.  Advised if symptoms worsen and/or unresolved please follow-up with PCP or here for further evaluation.     ED Prescriptions     Medication Sig Dispense Auth. Provider   nirmatrelvir & ritonavir (PAXLOVID, 300/100,) 20 x 150 MG & 10 x 100MG  TBPK Take 3 tablets by mouth 2 (two) times daily for 5 days. 30 tablet Trevor Iha, FNP   benzonatate (TESSALON) 200 MG capsule Take 1 capsule (200 mg total) by mouth 3 (three) times  daily as needed for up to 7 days. 40 capsule Trevor Iha, FNP   HYDROcodone bit-homatropine (HYCODAN) 5-1.5 MG/5ML syrup Take 5 mLs by mouth every 6 (six) hours as needed for cough. 120 mL Trevor Iha, FNP      I have reviewed the PDMP during this encounter.   Trevor Iha, FNP 06/11/23 1316

## 2023-06-11 NOTE — ED Triage Notes (Deleted)
Pt c/o nausea and vomiting x 3 days. Tested pos on Tues.

## 2023-06-11 NOTE — ED Triage Notes (Signed)
Cough, sore throat, nausea, body aches - tested positive for covid

## 2023-06-13 ENCOUNTER — Telehealth: Payer: Self-pay

## 2023-06-13 ENCOUNTER — Telehealth: Payer: Self-pay | Admitting: Family Medicine

## 2023-06-13 MED ORDER — PROMETHAZINE HCL 25 MG RE SUPP: 25.0000 mg | Freq: Four times a day (QID) | RECTAL | 0 refills | Status: AC | PRN

## 2023-06-13 NOTE — Telephone Encounter (Signed)
Patient has unremitting vomiting.  Requests suppository.  Will send to pharmacy

## 2023-06-13 NOTE — Telephone Encounter (Signed)
Pt returning nurse on staff's call today. Pt waiting on hold for nurse to become available.

## 2023-08-19 ENCOUNTER — Telehealth: Payer: Self-pay | Admitting: Cardiology

## 2023-08-19 DIAGNOSIS — E785 Hyperlipidemia, unspecified: Secondary | ICD-10-CM

## 2023-08-19 DIAGNOSIS — I10 Essential (primary) hypertension: Secondary | ICD-10-CM

## 2023-08-19 DIAGNOSIS — I259 Chronic ischemic heart disease, unspecified: Secondary | ICD-10-CM

## 2023-08-19 NOTE — Telephone Encounter (Signed)
Called patient. No VM pick up. My chart message sent. Labs ordered

## 2023-08-19 NOTE — Telephone Encounter (Signed)
  Patient would like to have her lab work done before her December appointment with [place]. Could orders be placed please?

## 2023-08-22 ENCOUNTER — Other Ambulatory Visit: Payer: Self-pay | Admitting: Cardiology

## 2023-10-16 NOTE — Progress Notes (Unsigned)
  Cardiology Office Note:  .   Date:  10/19/2023  ID:  Renee Fitzgerald, DOB 1976-04-04, MRN [id number] PCP: Renee Beath, MD  [facility] Providers Cardiologist: Renee Lemma, MD }   History of Present Illness: .   Renee Fitzgerald is a 47 y.o. female with history of coronary artery disease status post PCI to ostial to proximal RI in 2021 hyperlipidemia, hypertension.  Last seen by Dr. Herbie Fitzgerald on 10/17/2022.  At that time she was stable from a cardiac standpoint.  She is here today without any cardiac complaints.  She denies recurrent chest pain, palpitations, shortness of breath or dizziness.  She remains active walking her dog several times a week and also horseback riding.  She is medically compliant and denies any issues with antiplatelet therapy.  She reports that she has had some psychological issues related to having had a heart attack and has been placed on medication by PCP which was helpful to her anxiety associated with this.  ROS: As above otherwise negative  Studies Reviewed: Marland Kitchen   EKG Interpretation Date/Time:  Monday October 19 2023 09:13:19 EST Ventricular Rate:  60 PR Interval:  148 QRS Duration:  74 QT Interval:  402 QTC Calculation: 402 R Axis:   21  Text Interpretation: Normal sinus rhythm Normal ECG When compared with ECG of 31-May-2022 10:44, No significant change was found Confirmed by Renee Fitzgerald [phone]) on 10/19/2023 9:31:11 AM  .   Cardiac Cath-PCI 09/29/2020: Culprit Lesion -> ostial RI 100% (DES PCI-B Resolute Onyx 2.0 mm x 15 mm--2.3 mm).  Distal LAD 30% RCA and LCx.  EF 55 to 60% AntLat HK.      Physical Exam:   VS:  BP 118/76   Pulse 60   Ht 5\' 2"  (1.575 m)   Wt 173 lb 3.2 oz (78.6 kg)   LMP 10/16/2023   SpO2 97%   BMI 31.68 kg/m    Wt Readings from Last 3 Encounters:  10/19/23 173 lb 3.2 oz (78.6 kg)  03/13/23 165 lb (74.8 kg)  10/17/22 170 lb 6.4 oz (77.3 kg)    GEN: Well nourished, well developed in no acute  distress NECK: No JVD; No carotid bruits CARDIAC: RRR, no murmurs, rubs, gallops RESPIRATORY:  Clear to auscultation without rales, wheezing or rhonchi  ABDOMEN: Soft, non-tender, non-distended EXTREMITIES:  No edema; No deformity   ASSESSMENT AND PLAN: .    CAD: History of PCI to the ostial and proximal RI in 2021.  She is doing well from a cardiac standpoint and has no cardiac symptoms.  She remains active.  No changes to her medication regimen.  Continue secondary prevention with blood pressure control, purposeful exercise, lipid management, and low-cholesterol diet.  2.  Hypercholesterolemia: Goal of LDL is less than 70.  I reviewed her most recent labs and her LDL was 68.  Will continue her on her current medication regimen for lipid management, atorvastatin 80 mg 1/2 tablet daily.  Refills are approved if needed.  3.  Hypertension: Blood pressure is very well-controlled on carvedilol.  She denies any dizziness or fatigue associated.  Continue current management.         Signed, Renee Fitzgerald. Liborio Nixon, ANP, AACC

## 2023-10-17 LAB — COMPREHENSIVE METABOLIC PANEL
ALT: 11 [IU]/L (ref 0–32)
AST: 17 [IU]/L (ref 0–40)
Albumin: 4 g/dL (ref 3.9–4.9)
Alkaline Phosphatase: 88 [IU]/L (ref 44–121)
BUN/Creatinine Ratio: 12 (ref 9–23)
BUN: 8 mg/dL (ref 6–24)
Bilirubin Total: 0.3 mg/dL (ref 0.0–1.2)
CO2: 21 mmol/L (ref 20–29)
Calcium: 8.9 mg/dL (ref 8.7–10.2)
Chloride: 107 mmol/L — ABNORMAL HIGH (ref 96–106)
Creatinine, Ser: 0.68 mg/dL (ref 0.57–1.00)
Globulin, Total: 2.5 g/dL (ref 1.5–4.5)
Glucose: 88 mg/dL (ref 70–99)
Potassium: 4.4 mmol/L (ref 3.5–5.2)
Sodium: 143 mmol/L (ref 134–144)
Total Protein: 6.5 g/dL (ref 6.0–8.5)
eGFR: 107 mL/min/{1.73_m2} (ref >59)

## 2023-10-17 LAB — LIPID PANEL
Chol/HDL Ratio: 2.4 {ratio} (ref 0.0–4.4)
Cholesterol, Total: 140 mg/dL (ref 100–199)
HDL: 58 mg/dL (ref >39)
LDL Chol Calc (NIH): 68 mg/dL (ref 0–99)
Triglycerides: 68 mg/dL (ref 0–149)
VLDL Cholesterol Cal: 14 mg/dL (ref 5–40)

## 2023-10-17 LAB — HEPATIC FUNCTION PANEL: Bilirubin, Direct: 0.13 mg/dL (ref 0.00–0.40)

## 2023-10-19 ENCOUNTER — Ambulatory Visit: Payer: Managed Care, Other (non HMO) | Attending: Cardiology | Admitting: Adult Health

## 2023-10-19 ENCOUNTER — Ambulatory Visit: Payer: BC Managed Care – PPO | Admitting: Cardiology

## 2023-10-19 ENCOUNTER — Encounter: Payer: Self-pay | Admitting: Adult Health

## 2023-10-19 VITALS — BP 118/76 | HR 60 | Ht 62.0 in | Wt 173.2 lb

## 2023-10-19 DIAGNOSIS — I10 Essential (primary) hypertension: Secondary | ICD-10-CM | POA: Diagnosis not present

## 2023-10-19 DIAGNOSIS — E7849 Other hyperlipidemia: Secondary | ICD-10-CM

## 2023-10-19 DIAGNOSIS — E785 Hyperlipidemia, unspecified: Secondary | ICD-10-CM | POA: Diagnosis not present

## 2023-10-19 NOTE — Patient Instructions (Signed)
Medication Instructions:  No Changes *If you need a refill on your cardiac medications before your next appointment, please call your pharmacy*   Lab Work: No Labs If you have labs (blood work) drawn today and your tests are completely normal, you will receive your results only by: [place] (if you have MyChart) OR A paper copy in the mail If you have any lab test that is abnormal or we need to change your treatment, we will call you to review the results.   Testing/Procedures: No Testing   Follow-Up: At [facility], you and your health needs are our priority.  As part of our continuing mission to provide you with exceptional heart care, we have created designated Provider Care Teams.  These Care Teams include your primary Cardiologist (physician) and Advanced Practice Providers (APPs -  Physician Assistants and Nurse Practitioners) who all work together to provide you with the care you need, when you need it.  We recommend signing up for the patient portal called "MyChart".  Sign up information is provided on this After Visit Summary.  MyChart is used to connect with patients for Virtual Visits (Telemedicine).  Patients are able to view lab/test results, encounter notes, upcoming appointments, etc.  Non-urgent messages can be sent to your provider as well.   To learn more about what you can do with MyChart, go to [email or web address].    Your next appointment:   1 year(s)  Provider:   Glenetta Hew, MD

## 2023-11-15 ENCOUNTER — Other Ambulatory Visit: Payer: Self-pay | Admitting: Cardiology

## 2023-11-20 ENCOUNTER — Encounter: Payer: Self-pay | Admitting: Cardiology

## 2023-11-20 NOTE — Telephone Encounter (Signed)
 error

## 2023-12-13 NOTE — Progress Notes (Deleted)
  Cardiology Office Note:  .   Date:  12/13/2023  ID:  Renee Fitzgerald, DOB 1976-03-14, MRN [id number] PCP: Harmon Nest, MD  [facility] Providers Cardiologist: Alm Clay, MD { }   History of Present Illness: .   Renee Fitzgerald is a 48 y.o. female  with history of coronary artery disease status post PCI to ostial she was seen by me in the office on 10/19/2023, at which time she was without any cardiac complaints remained active walking her dog several times a week and also horseback riding  ROS: ***  Studies Reviewed: .   Cardiac Cath-PCI 09/29/2020: Culprit Lesion -> ostial RI 100% (DES PCI-B Resolute Onyx 2.0 mm x 15 mm--2.3 mm).  Distal LAD 30% RCA and LCx.  EF 55 to 60% AntLat HK.        *** EKG Interpretation Date/Time:    Ventricular Rate:    PR Interval:    QRS Duration:    QT Interval:    QTC Calculation:   R Axis:      Text Interpretation:      Physical Exam:   VS:  There were no vitals taken for this visit.   Wt Readings from Last 3 Encounters:  10/19/23 173 lb 3.2 oz (78.6 kg)  03/13/23 165 lb (74.8 kg)  10/17/22 170 lb 6.4 oz (77.3 kg)    GEN: Well nourished, well developed in no acute distress NECK: No JVD; No carotid bruits CARDIAC: ***RRR, no murmurs, rubs, gallops RESPIRATORY:  Clear to auscultation without rales, wheezing or rhonchi  ABDOMEN: Soft, non-tender, non-distended EXTREMITIES:  No edema; No deformity   ASSESSMENT AND PLAN: .   ***    {Are you ordering a CV Procedure (e.g. stress test, cath, DCCV, TEE, etc)?   Press F2        :[id number]}    Signed, Lamarr HERO. Jerilynn CHOL, ANP, AACC

## 2023-12-15 ENCOUNTER — Ambulatory Visit: Payer: Managed Care, Other (non HMO) | Admitting: Adult Health

## 2023-12-17 ENCOUNTER — Telehealth: Payer: Self-pay | Admitting: Cardiology

## 2023-12-17 NOTE — Telephone Encounter (Signed)
 Pt c/o of Chest Pain: STAT if active CP, including tightness, pressure, jaw pain, radiating pain to shoulder/upper arm/back, CP unrelieved by Nitro. Symptoms reported of SOB, nausea, vomiting, sweating.  1. Are you having CP right now? I am not having pain right now;   2. Are you experiencing any other symptoms (ex. SOB, nausea, vomiting, sweating)?  I am not experiencing any other symptoms such as nausea or sweating.    3. Is your CP continuous or coming and going? Discomfort is not continuous; as mentioned there are episodes where I get some pressure in my chest, it certainly isn't like the pain I experienced when I had my MI.     4. Have you taken Nitroglycerin ? I have not taken a Nitro during any of these episodes. However I did have some discomfort earlier and I chewed an aspirin .   5. How long have you been experiencing CP?  This has happened previously on 2 other occasions in the past month.   6. If NO CP at time of call then end call with telling Pt to call back or call 911 if Chest pain returns prior to return call from triage team.   as mentioned there are episodes where I get some pressure in my chest, it certainly isn't like the pain I experienced when I had my MI. I'm trying not to become anxious but can't help being concerned since I have had several of these episodes recently.  Answers received via pt schedule.

## 2023-12-17 NOTE — Telephone Encounter (Signed)
 Attempted to call patient, no answer left message requesting a call back.

## 2023-12-18 NOTE — Telephone Encounter (Signed)
 2nd attempt to call patient, no answer left message requesting a call back.

## 2023-12-21 NOTE — Telephone Encounter (Signed)
 3rd attempt to call patient, no answer, left message requesting a call back Nursing will await for patient to return call

## 2023-12-24 ENCOUNTER — Ambulatory Visit: Payer: Managed Care, Other (non HMO) | Attending: Nurse Practitioner | Admitting: Nurse Practitioner

## 2023-12-24 VITALS — BP 120/78 | HR 85 | Ht 62.0 in | Wt 178.0 lb

## 2023-12-24 DIAGNOSIS — R072 Precordial pain: Secondary | ICD-10-CM

## 2023-12-24 DIAGNOSIS — I10 Essential (primary) hypertension: Secondary | ICD-10-CM | POA: Diagnosis not present

## 2023-12-24 DIAGNOSIS — I25118 Atherosclerotic heart disease of native coronary artery with other forms of angina pectoris: Secondary | ICD-10-CM | POA: Diagnosis not present

## 2023-12-24 DIAGNOSIS — E785 Hyperlipidemia, unspecified: Secondary | ICD-10-CM

## 2023-12-24 DIAGNOSIS — I2510 Atherosclerotic heart disease of native coronary artery without angina pectoris: Secondary | ICD-10-CM | POA: Diagnosis not present

## 2023-12-24 DIAGNOSIS — Z9861 Coronary angioplasty status: Secondary | ICD-10-CM

## 2023-12-24 NOTE — Patient Instructions (Signed)
Medication Instructions:  Your physician recommends that you continue on your current medications as directed. Please refer to the Current Medication list given to you today.  *If you need a refill on your cardiac medications before your next appointment, please call your pharmacy*   Lab Work: NONE ordered at this time of appointment    Testing/Procedures:    Please report to Radiology at the [facility] Main Entrance 30 minutes early for your test.  [address] [place], [place] [address]                         OR   Please report to Radiology at [facility] Main Entrance, medical mall, 30 mins prior to your test.  [address]  [place], [place]  How to Prepare for Your Cardiac PET/CT Stress Test:  Nothing to eat or drink, except water, 3 hours prior to arrival time.  NO caffeine/decaffeinated products, or chocolate 12 hours prior to arrival. (Please note decaffeinated beverages (teas/coffees) still contain caffeine).  If you have caffeine within 12 hours prior, the test will need to be rescheduled.  Medication instructions: Do not take erectile dysfunction medications for 72 hours prior to test (sildenafil, tadalafil) Do not take nitrates (isosorbide mononitrate, Ranexa) the day before or day of test Do not take tamsulosin the day before or morning of test Hold theophylline containing medications for 12 hours. Hold Dipyridamole 48 hours prior to the test.  Diabetic Preparation: If able to eat breakfast prior to 3 hour fasting, you may take all medications, including your insulin. Do not worry if you miss your breakfast dose of insulin - start at your next meal. If you do not eat prior to 3 hour fast-Hold all diabetes (oral and insulin) medications. Patients who wear a continuous glucose monitor MUST remove the device prior to scanning.  You may take your remaining medications with water.  NO perfume, cologne or lotion on chest or  abdomen area. FEMALES - Please avoid wearing dresses to this appointment.  Total time is 1 to 2 hours; you may want to bring reading material for the waiting time.  IF YOU THINK YOU MAY BE PREGNANT, OR ARE NURSING PLEASE INFORM THE TECHNOLOGIST.  In preparation for your appointment, medication and supplies will be purchased.  Appointment availability is limited, so if you need to cancel or reschedule, please call the Radiology Department Scheduler at [phone] 24 hours in advance to avoid a cancellation fee of $100.00  What to Expect When you Arrive:  Once you arrive and check in for your appointment, you will be taken to a preparation room within the Radiology Department.  A technologist or Nurse will obtain your medical history, verify that you are correctly prepped for the exam, and explain the procedure.  Afterwards, an IV will be started in your arm and electrodes will be placed on your skin for EKG monitoring during the stress portion of the exam. Then you will be escorted to the PET/CT scanner.  There, staff will get you positioned on the scanner and obtain a blood pressure and EKG.  During the exam, you will continue to be connected to the EKG and blood pressure machines.  A small, safe amount of a radioactive tracer will be injected in your IV to obtain a series of pictures of your heart along with an injection of a stress agent.    After your Exam:  It is recommended that  you eat a meal and drink a caffeinated beverage to counter act any effects of the stress agent.  Drink plenty of fluids for the remainder of the day and urinate frequently for the first couple of hours after the exam.  Your doctor will inform you of your test results within 7-10 business days.  For more information and frequently asked questions, please visit our website: [email or web address]  For questions about your test or how to prepare for your test, please call: Cardiac Imaging Nurse  Navigators Office: [phone]    Follow-Up: At [facility], you and your health needs are our priority.  As part of our continuing mission to provide you with exceptional heart care, we have created designated Provider Care Teams.  These Care Teams include your primary Cardiologist (physician) and Advanced Practice Providers (APPs -  Physician Assistants and Nurse Practitioners) who all work together to provide you with the care you need, when you need it.  We recommend signing up for the patient portal called "MyChart".  Sign up information is provided on this After Visit Summary.  MyChart is used to connect with patients for Virtual Visits (Telemedicine).  Patients are able to view lab/test results, encounter notes, upcoming appointments, etc.  Non-urgent messages can be sent to your provider as well.   To learn more about what you can do with MyChart, go to [email or web address].    Your next appointment:   2 month(s)  Provider:   Bernadene Person, NP

## 2023-12-24 NOTE — Progress Notes (Addendum)
Office Visit    Patient Name: Renee Fitzgerald Date of Encounter: 12/24/2023  Primary Care Provider:  Ursula Beath, MD Primary Cardiologist:  Bryan Lemma, MD  Chief Complaint    48 year old female with a history of CAD s/p STEMI, DES-ostial to proximal ramus in 2021, hypertension, hyperlipidemia, and anemia who presents for follow-up related to CAD.  Past Medical History    Past Medical History:  Diagnosis Date   Anemia    CAD S/P PCI of 100% ost-prox RI 09/29/2020   Lateral STEMI -> 100% ost-prox Diag - Resolute Onyx DES 2.0 x 15 mm - 2.3 mm   Essential hypertension 10/01/2020   Hyperlipidemia with target LDL less than 70 10/01/2020   Migraine    ST elevation myocardial infarction (STEMI) of lateral wall (HCC) 09/29/2020   RI 100% - DES PCI.    Past Surgical History:  Procedure Laterality Date   CORONARY STENT INTERVENTION N/A 09/29/2020   Procedure: CORONARY STENT INTERVENTION;  Surgeon: Marykay Lex, MD;  Location: [facility] INVASIVE CV LAB;  Service: Cardiovascular:  Culprit Lesion -> ostial RI 100% (DES PCI-Resolute Onyx 2.0 mm x 15 mm--2.3 mm).   CORONARY/GRAFT ACUTE MI REVASCULARIZATION N/A 09/29/2020   Procedure: Coronary/Graft Acute MI Revascularization;  Surgeon: Marykay Lex, MD;  Location: [facility] INVASIVE CV LAB;  Service: Cardiovascular;: Insetting of lateral STEMI: PTCA-DES PCI of culprit lesion-ostial RI 100% thrombotic occlusion   LEFT HEART CATH AND CORONARY ANGIOGRAPHY N/A 09/29/2020   Procedure: LEFT HEART CATH AND CORONARY ANGIOGRAPHY;  Surgeon: Marykay Lex, MD;  Location: [facility] INVASIVE CV LAB;  Service: Cardiovascular: (LATERAL STEMI) \: Culprit Lesion -> ostial RI 100% (DES PCI).  Distal LAD 30% RCA and LCx.  EF 55 to 60% anterolateral HK.   TRANSTHORACIC ECHOCARDIOGRAM  09/30/2020   Lateral STEMI:  EF 60-65%.  No R WMA.  Normal diastolic pressures.  Normal valves.   WISDOM TOOTH EXTRACTION      Allergies  Allergies  Allergen Reactions    Penicillins Rash     Labs/Other Studies Reviewed    The following studies were reviewed today:  Cardiac Studies & Procedures   ______________________________________________________________________________________________ CARDIAC CATHETERIZATION  CARDIAC CATHETERIZATION 09/29/2020  Narrative  The left ventricular systolic function is normal. The left ventricular ejection fraction is 55-65% by visual estimate -suggestion of anterolateral hypokinesis  LV end diastolic pressure is normal. No MR or AI  ----------------------------------------  CULPRIT LESION: Ramus lesion is 100% stenosed.  A drug-eluting stent was successfully placed using a STENT RESOLUTE ONYX 2.0X15. Postdilated to 2.3 mm  Post intervention, there is a 0% residual stenosis.  Dist LAD-1 lesion is 30% stenosed. Dist LAD-2 lesion is 45% stenosed. With diffuse mild-mild to moderate disease throughout the RCA and LCx stent.  SUMMARY  Severe single-vessel disease with 100% occluded ostial and proximal Ramus Intermedius with otherwise mild to moderate disease elsewhere (mostly mild but moderate in the distal LAD)  Successful DES PCI of Ramus Intermedius using resolute Onyx DES 2.0 mm x 15 mm postdilated to 2.3 mm.  Normal LVEDP with mild lateral hypokinesis otherwise preserved EF.   RECOMMENDATIONS  Has admit orders to the ICU, however could be transferred to 6E or 2C stepdown units if ICU beds become urgent  Check 2D echo tomorrow  Guideline Directed Medical Therapy: Start low-dose carvedilol, high-dose high intensity statin along with DAPT.  Expect that she can probably be Fast-Track discharge on 11/22.   Bryan Lemma, MD  Findings Coronary Findings Diagnostic  Dominance: Right  Left Main Vessel is moderate in size.  Left Anterior Descending There is mild diffuse disease throughout the vessel. There is mild focal disease in the vessel. Dist LAD-1 lesion is 30% stenosed. Dist LAD-2 lesion is  45% stenosed. The lesion is dissected, segmental and irregular.  First Diagonal Branch Vessel is small in size.  Second Diagonal Branch Vessel is small in size.  Second Septal Branch Vessel is small in size.  Third Diagonal Branch Vessel is moderate in size.  Ramus Intermedius Ramus lesion is 100% stenosed. Vessel is the culprit lesion. The lesion is type C, focal and thrombotic.  Left Circumflex Vessel is moderate in size. The vessel exhibits minimal luminal irregularities. Terminates as a posterior lateral OM 2  First Obtuse Marginal Branch Vessel is small in size.  Right Coronary Artery Vessel was injected. There is mild diffuse disease throughout the vessel.  Acute Marginal Branch Vessel is small in size.  Right Ventricular Branch Vessel is small in size.  Right Posterior Descending Artery The vessel exhibits minimal luminal irregularities.  Second Right Posterolateral Branch Vessel is small in size.  Third Right Posterolateral Branch Vessel is small in size.  Intervention  Ramus lesion Stent Lesion length:  13 mm. CATH VISTA GUIDE 6FR XBLAD3.0 guide catheter was inserted. Lesion crossed with guidewire using a WIRE ASAHI PROWATER 180CM. Pre-stent angioplasty was performed using a BALLOON SAPPHIRE 2.0X10. Maximum pressure:  8 atm. Inflation time:  20 sec. A drug-eluting stent was successfully placed using a STENT RESOLUTE ONYX 2.0X15. Deployed at 16 ATM, then postdilated to 18 ATM Maximum pressure: 18 atm. Inflation time: 30 sec. Minimum lumen area:  2.3 mm. Stent strut is well apposed. Post-stent angioplasty was performed using a BALLOON SAPPHIRE [place] 2.25X12. Maximum pressure:  16 atm. Inflation time:  20 sec. Post-Intervention Lesion Assessment The intervention was successful. Pre-interventional TIMI flow is 0. Post-intervention TIMI flow is 3. Treated lesion length:  15 mm. No complications occurred at this lesion. There is a 0% residual stenosis post  intervention.     ECHOCARDIOGRAM  ECHOCARDIOGRAM COMPLETE 09/30/2020  Narrative ECHOCARDIOGRAM REPORT    Patient Name:   Renee Fitzgerald Date of Exam: 09/30/2020 Medical Rec #:  [id number]        Height:       62.0 in Accession #:    [id number]       Weight:       170.0 lb Date of Birth:  October 20, 1976        BSA:          1.784 m Patient Age:    44 years         BP:           129/83 mmHg Patient Gender: F                HR:           77 bpm. Exam Location:  Inpatient  Procedure: 2D Echo, Color Doppler and Cardiac Doppler  Indications:    CAD Native Vessel i25.0, STEMI  History:        Patient has no prior history of Echocardiogram examinations. CAD.  Sonographer:    Irving Burton Senior RDCS Referring Phys: [phone] DAVID W HARDING  IMPRESSIONS   1. Left ventricular ejection fraction, by estimation, is 60 to 65%. The left ventricle has normal function. The left ventricle demonstrates regional wall motion abnormalities (see scoring diagram/findings for description). Left ventricular diastolic parameters were normal. 2. Right ventricular systolic function is normal.  The right ventricular size is normal. 3. The mitral valve is normal in structure. No evidence of mitral valve regurgitation. No evidence of mitral stenosis. 4. The aortic valve is normal in structure. Aortic valve regurgitation is not visualized. No aortic stenosis is present. 5. The inferior vena cava is normal in size with greater than 50% respiratory variability, suggesting right atrial pressure of 3 mmHg.  FINDINGS Left Ventricle: Left ventricular ejection fraction, by estimation, is 60 to 65%. The left ventricle has normal function. The left ventricle demonstrates regional wall motion abnormalities. The left ventricular internal cavity size was normal in size. There is no left ventricular hypertrophy. Left ventricular diastolic parameters were normal.   LV Wall Scoring: The apical anterior segment is akinetic.  Right  Ventricle: The right ventricular size is normal. No increase in right ventricular wall thickness. Right ventricular systolic function is normal.  Left Atrium: Left atrial size was normal in size.  Right Atrium: Right atrial size was normal in size.  Pericardium: There is no evidence of pericardial effusion.  Mitral Valve: The mitral valve is normal in structure. No evidence of mitral valve regurgitation. No evidence of mitral valve stenosis.  Tricuspid Valve: The tricuspid valve is normal in structure. Tricuspid valve regurgitation is not demonstrated. No evidence of tricuspid stenosis.  Aortic Valve: The aortic valve is normal in structure. Aortic valve regurgitation is not visualized. No aortic stenosis is present.  Pulmonic Valve: The pulmonic valve was normal in structure. Pulmonic valve regurgitation is not visualized. No evidence of pulmonic stenosis.  Aorta: The aortic root is normal in size and structure.  Venous: The inferior vena cava is normal in size with greater than 50% respiratory variability, suggesting right atrial pressure of 3 mmHg.  IAS/Shunts: No atrial level shunt detected by color flow Doppler.   LEFT VENTRICLE PLAX 2D LVIDd:         4.40 cm  Diastology LVIDs:         2.70 cm  LV e' medial:    7.83 cm/s LV PW:         1.10 cm  LV E/e' medial:  9.2 LV IVS:        1.10 cm  LV e' lateral:   11.70 cm/s LVOT diam:     1.80 cm  LV E/e' lateral: 6.2 LV SV:         67 LV SV Index:   37 LVOT Area:     2.54 cm   RIGHT VENTRICLE RV S prime:     9.79 cm/s TAPSE (M-mode): 1.8 cm  LEFT ATRIUM             Index       RIGHT ATRIUM           Index LA diam:        3.70 cm 2.07 cm/m  RA Area:     14.50 cm LA Vol (A2C):   55.0 ml 30.83 ml/m RA Volume:   35.40 ml  19.84 ml/m LA Vol (A4C):   33.6 ml 18.83 ml/m LA Biplane Vol: 43.4 ml 24.33 ml/m AORTIC VALVE LVOT Vmax:   116.00 cm/s LVOT Vmean:  83.600 cm/s LVOT VTI:    0.262 m  AORTA Ao Root diam: 3.20 cm Ao  Asc diam:  3.30 cm  MITRAL VALVE MV Area (PHT): 3.42 cm    SHUNTS MV Decel Time: 222 msec    Systemic VTI:  0.26 m MV E velocity: 72.30 cm/s  Systemic Diam: 1.80 cm  MV A velocity: 64.50 cm/s MV E/A ratio:  1.12  Donato Schultz MD Electronically signed by Donato Schultz MD Signature Date/Time: 09/30/2020/11:29:18 AM    Final          ______________________________________________________________________________________________     Recent Labs: 10/16/2023: ALT 11; BUN 8; Creatinine, Ser 0.68; Potassium 4.4; Sodium 143  Recent Lipid Panel    Component Value Date/Time   CHOL 140 10/16/2023 1247   TRIG 68 10/16/2023 1247   HDL 58 10/16/2023 1247   CHOLHDL 2.4 10/16/2023 1247   CHOLHDL 3.1 09/30/2020 0103   VLDL 18 09/30/2020 0103   LDLCALC 68 10/16/2023 1247    History of Present Illness    48 year old female with with the above past medical history including CAD s/p STEMI, DES-ostial to proximal ramus in 2021, hypertension, hyperlipidemia, and anemia.  She was hospitalized in November 2021 in the setting NSTEMI.  Cardiac catheterization revealed 100% occluded ostial to proximal ramus intermedius s/p DES, otherwise mild to moderate nonobstructive CAD (30% distal LAD-1 lesion, 45% distal LAD-2 lesion, diffuse mild to moderate disease throughout the RCA and LCx).  Echocardiogram showed EF 60 to 65%, normal LV function, normal RV systolic function, no significant valvular abnormalities.  She was last seen in the office on 10/19/2023 and was stable from a cardiac standpoint.  She denied symptoms concerning for angina. She contacted our office on 12/17/2023 and noted intermittent chest discomfort over the past several months.  She presents today for follow-up.  Since her last visit she has been stable overall from a cardiac standpoint.  However, over the past several months she has had 3 episodes of chest pressure, each episode lasting approximately 5 minutes  During one instance she took a  baby aspirin, and her symptoms improved.  She has not taken nitroglycerin. She denies exertional chest pain or shortness of breath.  She has been walking for exercise, however, her activity has been somewhat limited in the setting of bursitis to her hips. She continues to ride horses.  She is, however, concerned that her symptoms could represent anginal equivalent given her history of premature CAD.  Home Medications    Current Outpatient Medications  Medication Sig Dispense Refill   albuterol (VENTOLIN HFA) 108 (90 Base) MCG/ACT inhaler SMARTSIG:2 Puff(s) By Mouth Every 6 Hours PRN     ALPRAZolam (XANAX) 0.25 MG tablet Take 0.25 mg by mouth daily as needed for anxiety.      aspirin EC 81 MG tablet Take 1 tablet (81 mg total) by mouth daily. Swallow whole. Start taking after you complete the Brilinta 90 tablet 3   atorvastatin (LIPITOR) 80 MG tablet TAKE HALF A TABLET BY MOUTH DAILY 45 tablet 3   carvedilol (COREG) 3.125 MG tablet TAKE 1 TABLET BY MOUTH TWICE A DAY WITH FOOD 180 tablet 3   Melatonin 10 MG TABS Take 10 mg by mouth at bedtime.     Multiple Vitamin (MULTIVITAMIN WITH MINERALS) TABS tablet Take 1 tablet by mouth once a week.     nitroGLYCERIN (NITROSTAT) 0.4 MG SL tablet PLACE 1 TABLET UNDER THE TONGUE EVERY 5 MINUTES AS NEEDED. 25 tablet 7   norethindrone (MICRONOR) 0.35 MG tablet Take 1 tablet by mouth daily.     ondansetron (ZOFRAN) 4 MG tablet Take 1 tablet (4 mg total) by mouth every 8 (eight) hours as needed for nausea or vomiting. 12 tablet 0   pantoprazole (PROTONIX) 40 MG tablet Take 1 tablet (40 mg total) by mouth daily. 90 tablet 1  promethazine (PHENERGAN) 25 MG suppository Place 1 suppository (25 mg total) rectally every 6 (six) hours as needed for nausea or vomiting. 12 each 0   sertraline (ZOLOFT) 25 MG tablet Take 25 mg by mouth daily.     Ubrogepant (UBRELVY) 100 MG TABS Take by mouth.     zolpidem (AMBIEN) 10 MG tablet Take 10 mg by mouth at bedtime as needed for  sleep.     No current facility-administered medications for this visit.     Review of Systems    She denies palpitations, dyspnea, pnd, orthopnea, n, v, dizziness, syncope, edema, weight gain, or early satiety. All other systems reviewed and are otherwise negative except as noted above.   Physical Exam    VS:  BP 120/78 (BP Location: Left Arm, Patient Position: Sitting, Cuff Size: Normal)   Pulse 85   Ht 5\' 2"  (1.575 m)   Wt 178 lb (80.7 kg)   SpO2 97%   BMI 32.56 kg/m  GEN: Well nourished, well developed, in no acute distress. HEENT: normal. Neck: Supple, no JVD, carotid bruits, or masses. Cardiac: RRR, no murmurs, rubs, or gallops. No clubbing, cyanosis, edema.  Radials/DP/PT 2+ and equal bilaterally.  Respiratory:  Respirations regular and unlabored, clear to auscultation bilaterally. GI: Soft, nontender, nondistended, BS + x 4. MS: no deformity or atrophy. Skin: warm and dry, no rash. Neuro:  Strength and sensation are intact. Psych: Normal affect.  Accessory Clinical Findings    ECG personally reviewed by me today - EKG Interpretation Date/Time:  Thursday December 24 2023 14:59:19 EST Ventricular Rate:  70 PR Interval:  156 QRS Duration:  82 QT Interval:  384 QTC Calculation: 414 R Axis:   29  Text Interpretation: Normal sinus rhythm Normal ECG When compared with ECG of 19-Oct-2023 09:13, No significant change was found Confirmed by Bernadene Person ([id number]) on 12/24/2023 3:02:11 PM  - no significant change.    Lab Results  Component Value Date   WBC 5.9 05/31/2022   HGB 11.1 (L) 05/31/2022   HCT 34.9 (L) 05/31/2022   MCV 91.1 05/31/2022   PLT 256 05/31/2022   Lab Results  Component Value Date   CREATININE 0.68 10/16/2023   BUN 8 10/16/2023   NA 143 10/16/2023   K 4.4 10/16/2023   CL 107 (H) 10/16/2023   CO2 21 10/16/2023   Lab Results  Component Value Date   ALT 11 10/16/2023   AST 17 10/16/2023   ALKPHOS 88 10/16/2023   BILITOT 0.3 10/16/2023   Lab  Results  Component Value Date   CHOL 140 10/16/2023   HDL 58 10/16/2023   LDLCALC 68 10/16/2023   TRIG 68 10/16/2023   CHOLHDL 2.4 10/16/2023    Lab Results  Component Value Date   HGBA1C 5.4 09/30/2020    Assessment & Plan    1. CAD: S/p STEMI, DES-ostial to proximal ramus in 2021.  She reports recent episodes of chest pressure, symptoms have occurred primarily at rest.  She denies any specific exertional symptoms.  We discussed that her symptoms are overall atypical.  EKG is stable in office today.  We discussed options for management including observation, antianginal therapy, and possible ischemic evaluation.  Given her history of premature CAD, she is very concerned that her symptoms could represent anginal equivalent, therefore, through shared decision making, will pursue cardiac PET stress test. Reviewed ED precautions. Continue aspirin, carvedilol, Lipitor.  ADDENDUM 12/31/2023: As mentioned above, patient is experiencing significant bursitis in her hips which has  limited her ability to exercise. She is likely unable to reach her target heart rate and is therefore not a candidate for ETT at this time.  Informed Consent   Shared Decision Making/Informed Consent The risks [chest pain, shortness of breath, cardiac arrhythmias, dizziness, blood pressure fluctuations, myocardial infarction, stroke/transient ischemic attack, nausea, vomiting, allergic reaction, radiation exposure, metallic taste sensation and life-threatening complications (estimated to be 1 in 10,000)], benefits (risk stratification, diagnosing coronary artery disease, treatment guidance) and alternatives of a cardiac PET stress test were discussed in detail with Ms. Blumenfeld and she agrees to proceed.     2. Hypertension: BP well controlled. Continue current antihypertensive regimen.   3. Hyperlipidemia: LDL was 68 in 10/2023.  Continue Lipitor.  4. Disposition: Follow-up in 2 months.      Joylene Grapes,  NP 12/24/2023, 3:03 PM

## 2023-12-25 ENCOUNTER — Encounter: Payer: Self-pay | Admitting: Family Medicine

## 2023-12-25 ENCOUNTER — Encounter: Payer: Self-pay | Admitting: Nurse Practitioner

## 2023-12-25 ENCOUNTER — Ambulatory Visit: Payer: Managed Care, Other (non HMO) | Admitting: Family Medicine

## 2023-12-25 VITALS — BP 110/72 | Ht 62.0 in | Wt 175.0 lb

## 2023-12-25 DIAGNOSIS — M25552 Pain in left hip: Secondary | ICD-10-CM | POA: Diagnosis not present

## 2023-12-25 DIAGNOSIS — M25551 Pain in right hip: Secondary | ICD-10-CM | POA: Diagnosis not present

## 2023-12-25 NOTE — Progress Notes (Signed)
CHIEF COMPLAINT: No chief complaint on file.  _____________________________________________________________ SUBJECTIVE  HPI  Pt is a 48 y.o. female here for evaluation of bilateral hip pain  03/2023 underwent US guided b/l GTB CSI  Ongoing for a couple of years Inciting event: none known Primarily located: lateral hip, worse on the right Radiating: down the lateral leg, stops at the knees Numbness/tingling: none Catching/locking: none Exacerbated by: nothing makes it worse, can't get comfortable. Sitting or laying down doesn't make it comfortable. Walking long distances Therapies tried so far: tylenol, home stretches, states she doesn't have time for PT, prior to that wasn't able to make these visits Horseback riding 2x/week, finds it uncomfortable.   ------------------------------------------------------------------------------------------------------ Past Medical History:  Diagnosis Date   Anemia    CAD S/P PCI of 100% ost-prox RI 09/29/2020   Lateral STEMI -> 100% ost-prox Diag - Resolute Onyx DES 2.0 x 15 mm - 2.3 mm   Essential hypertension 10/01/2020   Hyperlipidemia with target LDL less than 70 10/01/2020   Migraine    ST elevation myocardial infarction (STEMI) of lateral wall (HCC) 09/29/2020   RI 100% - DES PCI.     Past Surgical History:  Procedure Laterality Date   CORONARY STENT INTERVENTION N/A 09/29/2020   Procedure: CORONARY STENT INTERVENTION;  Surgeon: Marykay Lex, MD;  Location: [facility] INVASIVE CV LAB;  Service: Cardiovascular:  Culprit Lesion -> ostial RI 100% (DES PCI-Resolute Onyx 2.0 mm x 15 mm--2.3 mm).   CORONARY/GRAFT ACUTE MI REVASCULARIZATION N/A 09/29/2020   Procedure: Coronary/Graft Acute MI Revascularization;  Surgeon: Marykay Lex, MD;  Location: [facility] INVASIVE CV LAB;  Service: Cardiovascular;: Insetting of lateral STEMI: PTCA-DES PCI of culprit lesion-ostial RI 100% thrombotic occlusion   LEFT HEART CATH AND CORONARY ANGIOGRAPHY N/A  09/29/2020   Procedure: LEFT HEART CATH AND CORONARY ANGIOGRAPHY;  Surgeon: Marykay Lex, MD;  Location: [facility] INVASIVE CV LAB;  Service: Cardiovascular: (LATERAL STEMI) \: Culprit Lesion -> ostial RI 100% (DES PCI).  Distal LAD 30% RCA and LCx.  EF 55 to 60% anterolateral HK.   TRANSTHORACIC ECHOCARDIOGRAM  09/30/2020   Lateral STEMI:  EF 60-65%.  No R WMA.  Normal diastolic pressures.  Normal valves.   WISDOM TOOTH EXTRACTION        Outpatient Encounter Medications as of 12/25/2023  Medication Sig   albuterol (VENTOLIN HFA) 108 (90 Base) MCG/ACT inhaler SMARTSIG:2 Puff(s) By Mouth Every 6 Hours PRN   ALPRAZolam (XANAX) 0.25 MG tablet Take 0.25 mg by mouth daily as needed for anxiety.    aspirin EC 81 MG tablet Take 1 tablet (81 mg total) by mouth daily. Swallow whole. Start taking after you complete the Brilinta   atorvastatin (LIPITOR) 80 MG tablet TAKE HALF A TABLET BY MOUTH DAILY   carvedilol (COREG) 3.125 MG tablet TAKE 1 TABLET BY MOUTH TWICE A DAY WITH FOOD   Melatonin 10 MG TABS Take 10 mg by mouth at bedtime.   Multiple Vitamin (MULTIVITAMIN WITH MINERALS) TABS tablet Take 1 tablet by mouth once a week.   nitroGLYCERIN (NITROSTAT) 0.4 MG SL tablet PLACE 1 TABLET UNDER THE TONGUE EVERY 5 MINUTES AS NEEDED.   norethindrone (MICRONOR) 0.35 MG tablet Take 1 tablet by mouth daily.   ondansetron (ZOFRAN) 4 MG tablet Take 1 tablet (4 mg total) by mouth every 8 (eight) hours as needed for nausea or vomiting.   pantoprazole (PROTONIX) 40 MG tablet Take 1 tablet (40 mg total) by mouth daily.   promethazine (PHENERGAN) 25 MG suppository  Place 1 suppository (25 mg total) rectally every 6 (six) hours as needed for nausea or vomiting.   sertraline (ZOLOFT) 25 MG tablet Take 25 mg by mouth daily.   Ubrogepant (UBRELVY) 100 MG TABS Take by mouth.   zolpidem (AMBIEN) 10 MG tablet Take 10 mg by mouth at bedtime as needed for sleep.   No facility-administered encounter medications on file as of  12/25/2023.   ------------------------------------------------------------------------------------------------------ _____________________________________________________________ OBJECTIVE  PHYSICAL EXAM  Today's Vitals   12/25/23 0808  BP: 110/72  Weight: 175 lb (79.4 kg)  Height: 5\' 2"  (1.575 m)   Body mass index is 32.01 kg/m.   reviewed  General: A+Ox3, no acute distress, well-nourished, appropriate affect CV: pulses 2+ regular, nondiaphoretic, no peripheral edema, cap refill <2sec Lungs: no audible wheezing, non-labored breathing, bilateral chest rise/fall, nontachypneic Skin: warm, well-perfused, non-icteric, no susp lesions or rashes Neuro:  Sensation intact, muscle tone wnl, no atrophy Psych: no signs of depression or anxiety MSK:   Hips: No deformity, swelling or wasting +trendelenberg B/l SIJ tenderness, TTP glute musculature, L greater trochanter NTTP over the hip flexors, adductors, inguinal canal, pubic ramus, greater troch, lumbar spine Tight hamstring and quads, reduced ROM on the R Pain not provoked with resisted hip adduction/abduction Negative log roll with FROM Negative FABER, Negative FADIR, + Ober test R>L, +tenderness over gerdy's tubercle Negative scour, stinchfield  _____________________________________________________________ ASSESSMENT/PLAN Diagnoses and all orders for this visit:  Bilateral hip pain -     Ambulatory referral to Physical Therapy  Multifactorial etiology to hip pain on exam today, with GTPS, ITBS, somatic asymmetry/ dysfunction. No radicular symptoms. Discussed options for management. Patient opts to defer GTB CSI at this time in favor of physical therapy, referral sent today. OTC therapies reviewed. All questions answered. Return precautions discussed, anticipate f/u in 4-6 weeks, sooner as needed for refractory, worsening, or new concerns. Patient verbalized understanding and is in agreement with plan  Electronically signed  by: Burna Forts, MD 12/25/2023 7:03 AM

## 2023-12-27 ENCOUNTER — Encounter: Payer: Self-pay | Admitting: Family Medicine

## 2023-12-31 ENCOUNTER — Other Ambulatory Visit: Payer: Self-pay | Admitting: Family Medicine

## 2023-12-31 ENCOUNTER — Telehealth: Payer: Self-pay

## 2023-12-31 MED ORDER — CYCLOBENZAPRINE HCL 10 MG PO TABS: 10.0000 mg | ORAL_TABLET | Freq: Three times a day (TID) | ORAL | 0 refills | Status: DC | PRN

## 2023-12-31 NOTE — Progress Notes (Unsigned)
Trial flexeril for symptom management

## 2024-01-03 NOTE — Telephone Encounter (Signed)
 Unfortunately, I have not seen this patient in almost 2 years.  I am finding it very difficult to get stress PET approved by insurance companies myself.  It all has to do with the wording or verbiage indicating concern about extremely high risk of ischemic CAD.  If the note does not say that, I can do what appear to be want and it will not make a difference, since I did not see her.  The other option would be to simply switch to a Myoview.  Bryan Lemma, MD

## 2024-01-04 ENCOUNTER — Telehealth: Payer: Self-pay

## 2024-01-04 NOTE — Addendum Note (Signed)
 Addended by: Lamar Benes on: 01/04/2024 05:30 PM   Modules accepted: Orders

## 2024-01-04 NOTE — Telephone Encounter (Signed)
 Lmom to discuss Cardiac PET stress test denial by pts insurance. An order for a [employer] as been ordered. Will discuss further with pt when she returns call. Please arrange Lexiscan.

## 2024-01-05 ENCOUNTER — Encounter (HOSPITAL_COMMUNITY): Payer: Self-pay | Admitting: Nurse Practitioner

## 2024-01-08 ENCOUNTER — Telehealth (HOSPITAL_COMMUNITY): Payer: Self-pay | Admitting: *Deleted

## 2024-01-08 NOTE — Telephone Encounter (Signed)
 Left message on voicemail per DPR in reference to upcoming appointment scheduled on 01/13/2024 at 10:30 with detailed instructions given per Myocardial Perfusion Study Information Sheet for the test. LM to arrive 15 minutes early, and that it is imperative to arrive on time for appointment to keep from having the test rescheduled. If you need to cancel or reschedule your appointment, please call the office within 24 hours of your appointment. Failure to do so may result in a cancellation of your appointment, and a $50 no show fee. Phone number given for call back for any questions.

## 2024-01-08 NOTE — Telephone Encounter (Signed)
 Noted.

## 2024-01-12 ENCOUNTER — Ambulatory Visit: Payer: Managed Care, Other (non HMO) | Admitting: Adult Health

## 2024-01-13 ENCOUNTER — Ambulatory Visit (HOSPITAL_COMMUNITY): Payer: Managed Care, Other (non HMO) | Attending: Nurse Practitioner

## 2024-01-13 DIAGNOSIS — I10 Essential (primary) hypertension: Secondary | ICD-10-CM | POA: Diagnosis present

## 2024-01-13 DIAGNOSIS — E785 Hyperlipidemia, unspecified: Secondary | ICD-10-CM

## 2024-01-13 DIAGNOSIS — I25118 Atherosclerotic heart disease of native coronary artery with other forms of angina pectoris: Secondary | ICD-10-CM

## 2024-01-13 DIAGNOSIS — R072 Precordial pain: Secondary | ICD-10-CM | POA: Diagnosis present

## 2024-01-13 LAB — MYOCARDIAL PERFUSION IMAGING
Estimated workload: 1
Exercise duration (min): 1 min
Exercise duration (sec): 0 s
LV dias vol: 73 mL (ref 46–106)
LV sys vol: 23 mL
MPHR: 171 {beats}/min
Nuc Stress EF: 68 %
Peak HR: 112 {beats}/min
Percent HR: 65 %
Rest HR: 65 {beats}/min
Rest Nuclear Isotope Dose: 10.1 mCi
SDS: 0
SRS: 6
SSS: 6
ST Depression (mm): 0 mm
Stress Nuclear Isotope Dose: 32.3 mCi
TID: 1.02

## 2024-01-13 MED ORDER — REGADENOSON 0.4 MG/5ML IV SOLN: 0.4000 mg | Freq: Once | INTRAVENOUS | Status: AC

## 2024-01-13 MED ORDER — TECHNETIUM TC 99M TETROFOSMIN IV KIT
32.3000 | PACK | Freq: Once | INTRAVENOUS | Status: AC | PRN
Start: 2024-01-13 — End: ?

## 2024-01-13 MED ORDER — TECHNETIUM TC 99M TETROFOSMIN IV KIT
10.1000 | PACK | Freq: Once | INTRAVENOUS | Status: AC | PRN
  Administered 2024-01-13: 10.1 via INTRAVENOUS

## 2024-02-02 ENCOUNTER — Ambulatory Visit: Admitting: Physical Therapy

## 2024-02-19 ENCOUNTER — Ambulatory Visit: Payer: Managed Care, Other (non HMO) | Admitting: Nurse Practitioner

## 2024-02-24 ENCOUNTER — Other Ambulatory Visit: Payer: Self-pay | Admitting: Cardiology

## 2024-02-24 ENCOUNTER — Other Ambulatory Visit: Payer: Self-pay | Admitting: *Deleted

## 2024-02-24 MED ORDER — CYCLOBENZAPRINE HCL 10 MG PO TABS: 10.0000 mg | ORAL_TABLET | Freq: Three times a day (TID) | ORAL | 0 refills | Status: AC | PRN

## 2024-04-11 ENCOUNTER — Encounter: Payer: Self-pay | Admitting: Cardiology

## 2024-04-14 NOTE — Telephone Encounter (Signed)
 I have not seen this patient in quite a while.  I am not 100% sure what she is wanting a GLP-1 agonist for.  I do think that there probably is an indication from a cardiac standpoint for GLP-1 agonists without diabetes based on her CAD. I do not routinely write GLP-1 agonist so I am going to ask a little bit of help from our clinical pharmacist team at the [facility] office to chime in on the subject.  Randene Bustard, MD

## 2024-04-15 ENCOUNTER — Encounter: Payer: Self-pay | Admitting: Pharmacist

## 2024-04-19 ENCOUNTER — Other Ambulatory Visit (HOSPITAL_COMMUNITY): Payer: Self-pay

## 2024-04-19 ENCOUNTER — Telehealth: Payer: Self-pay | Admitting: Pharmacy Technician

## 2024-04-19 NOTE — Telephone Encounter (Signed)
 Pharmacy Patient Advocate Encounter   Received notification from Physician's Office that prior authorization for Wegovy 0.25MG  is required/requested.   Insurance verification completed.   The patient is insured through [employer] .   Per test claim: The current 04/19/24 day co-pay is, $1537.88- one month.  No PA needed at this time. This test claim was processed through [facility]- copay amounts may vary at other pharmacies due to pharmacy/plan contracts, or as the patient moves through the different stages of their insurance plan.

## 2024-04-25 NOTE — Telephone Encounter (Signed)
 Routing to providers as requested

## 2024-07-13 ENCOUNTER — Encounter: Payer: Self-pay | Admitting: Cardiology

## 2024-07-13 DIAGNOSIS — I25118 Atherosclerotic heart disease of native coronary artery with other forms of angina pectoris: Secondary | ICD-10-CM

## 2024-07-13 DIAGNOSIS — E785 Hyperlipidemia, unspecified: Secondary | ICD-10-CM

## 2024-07-14 ENCOUNTER — Other Ambulatory Visit: Payer: Self-pay | Admitting: *Deleted

## 2024-07-14 DIAGNOSIS — E785 Hyperlipidemia, unspecified: Secondary | ICD-10-CM

## 2024-07-14 DIAGNOSIS — I25118 Atherosclerotic heart disease of native coronary artery with other forms of angina pectoris: Secondary | ICD-10-CM

## 2024-08-11 ENCOUNTER — Other Ambulatory Visit: Payer: Self-pay | Admitting: Medical Genetics

## 2024-08-11 ENCOUNTER — Other Ambulatory Visit: Payer: Self-pay | Admitting: Cardiology

## 2024-08-11 ENCOUNTER — Encounter: Payer: Self-pay | Admitting: Cardiology

## 2024-08-11 MED ORDER — PANTOPRAZOLE SODIUM 40 MG PO TBEC: 40.0000 mg | DELAYED_RELEASE_TABLET | Freq: Every day | ORAL | 3 refills | Status: AC

## 2024-10-18 ENCOUNTER — Ambulatory Visit: Admitting: Cardiology

## 2024-11-09 ENCOUNTER — Ambulatory Visit: Payer: Self-pay | Admitting: Cardiovascular Disease

## 2024-11-09 LAB — COMPREHENSIVE METABOLIC PANEL WITH GFR
ALT: 18 IU/L (ref 0–32)
AST: 17 IU/L (ref 0–40)
Albumin: 4.4 g/dL (ref 3.9–4.9)
Alkaline Phosphatase: 78 IU/L (ref 41–116)
BUN/Creatinine Ratio: 13 (ref 9–23)
BUN: 10 mg/dL (ref 6–24)
Bilirubin Total: 0.5 mg/dL (ref 0.0–1.2)
CO2: 22 mmol/L (ref 20–29)
Calcium: 9.7 mg/dL (ref 8.7–10.2)
Chloride: 101 mmol/L (ref 96–106)
Creatinine, Ser: 0.79 mg/dL (ref 0.57–1.00)
Globulin, Total: 2.1 g/dL (ref 1.5–4.5)
Glucose: 91 mg/dL (ref 70–99)
Potassium: 4.4 mmol/L (ref 3.5–5.2)
Sodium: 138 mmol/L (ref 134–144)
Total Protein: 6.5 g/dL (ref 6.0–8.5)
eGFR: 91 mL/min/1.73

## 2024-11-09 LAB — LIPID PANEL
Chol/HDL Ratio: 2 ratio (ref 0.0–4.4)
Cholesterol, Total: 146 mg/dL (ref 100–199)
HDL: 74 mg/dL
LDL Chol Calc (NIH): 58 mg/dL (ref 0–99)
Triglycerides: 68 mg/dL (ref 0–149)
VLDL Cholesterol Cal: 14 mg/dL (ref 5–40)

## 2024-11-14 ENCOUNTER — Encounter: Payer: Self-pay | Admitting: Cardiology

## 2024-11-14 ENCOUNTER — Ambulatory Visit: Attending: Cardiology | Admitting: Cardiology

## 2024-11-14 VITALS — BP 136/77 | HR 64 | Resp 16 | Ht 62.0 in | Wt 184.4 lb

## 2024-11-14 DIAGNOSIS — E785 Hyperlipidemia, unspecified: Secondary | ICD-10-CM | POA: Diagnosis not present

## 2024-11-14 DIAGNOSIS — I2510 Atherosclerotic heart disease of native coronary artery without angina pectoris: Secondary | ICD-10-CM | POA: Diagnosis not present

## 2024-11-14 DIAGNOSIS — R030 Elevated blood-pressure reading, without diagnosis of hypertension: Secondary | ICD-10-CM | POA: Diagnosis not present

## 2024-11-14 DIAGNOSIS — I2129 ST elevation (STEMI) myocardial infarction involving other sites: Secondary | ICD-10-CM | POA: Diagnosis not present

## 2024-11-14 DIAGNOSIS — Z9861 Coronary angioplasty status: Secondary | ICD-10-CM

## 2024-11-14 DIAGNOSIS — I25118 Atherosclerotic heart disease of native coronary artery with other forms of angina pectoris: Secondary | ICD-10-CM

## 2024-11-14 NOTE — Progress Notes (Signed)
 " Cardiology Office Note:  .   Date:  11/19/2024  ID:  Renee Fitzgerald, DOB 08-26-1976, MRN [id number] PCP: Harmon Nest, MD  [facility] Providers Cardiologist:  Alm Clay, MD Cardiology APP:  Daneen Damien BROCKS, NP     Chief Complaint  Patient presents with   ST elevation myocardial infarction (STEMI) of lateral wall     2021.  RI PCI   Follow-up    1 year   Coronary Artery Disease    No angina.  Myoview  in March 2025 nonischemic.    Patient Profile: .     Renee Fitzgerald is a 49 y.o. female with a PMH notable for CAD-lateral STEMI (November 2021 with DES PCI of the ostial RI), HTN, & HLD, and anemia who presents here for essentially annual follow-up.   She returns at the request of Harmon Nest, MD.  PMH: CAD s/p STEMI, DES-ostial to proximal RI in 2021,  hypertension, hyperlipidemia, and anemia   I last saw her in December 2023.  At that time she was doing very well with no major issues.  Recommendation was to complete current bottle of Brilinta  and stop completely.  Converting to aspirin  81 mg, but otherwise no major changes.  She was then seen in December 2024 by Lamarr Satterfield, NP, again with no major issues.  Remaining active walking her dog several times a week and horseback riding.  No cardiac issues and no issues with antiplatelets.    Renee Fitzgerald was last seen on December 24, 2023 by Damien Daneen, NP with complaints of 3 episodes of chest discomfort lasting about 5 minutes.  Not necessarily associate with exertion.  However she did take aspirin  1 time and there was improvement in symptoms.  No exertional dyspnea.  No other symptoms to suggest any further concerns.  She was evaluated with a Myoview  stress test reviewed below-nonischemic.  Subjective  Discussed the use of AI scribe software for clinical note transcription with the patient, who gave verbal consent to proceed.  History of Present Illness Renee Fitzgerald is a 49 year old female  with coronary artery disease who presents for a routine follow-up.  Since her prior visit, she denies any recurrent episodes of  chest pain, pressure, tightness, palpitations, dizziness, or peripheral edema. No orthopnea or paroxysmal nocturnal dyspnea.  No syncope or near syncope, no TIA or amaurosis fugax.  No claudication. However, her activity level has been notably reduced since has been dealing with sciatica pain since December this limiting her walking.  She is hoping to get into physical therapy.  Her current medications include   She had a squamous cell carcinoma excised from her back in August. No further details are provided about the condition of her back.  Her last cholesterol check in December was normal, and she continues to manage her cholesterol with atorvastatin . She reports that her blood pressure is borderline but has not specified any current issues related to it.     Objective   Medications:  Aspirin  81 mg daily, atorvastatin  40 mg daily (1/2 to 80 mg tablet), carvedilol  3.125 mg twice daily,  Sertraline 25 mg daily Fluorouracil 5% cream 2 times daily.  PRNs: Ubrelvy 100 mg for headaches; Pantoprazole  40 mg as needed for GERD; ondansetron  nausea, albuterol for wheezing, alprazolam 0.2 mg for anxiety and zolpidem 10 mg or melatonin 10 mg as needed for sleep.  Studies Reviewed: SABRA   EKG Interpretation Date/Time:  Monday November 14 2024 16:15:29 EST Ventricular  Rate:  69 PR Interval:  154 QRS Duration:  82 QT Interval:  380 QTC Calculation: 407 R Axis:   17  Text Interpretation: Normal sinus rhythm Possible Anterior infarct , age undetermined When compared with ECG of 24-Dec-2023 14:59, No significant change was found Confirmed by Anner Lenis ([id number]) on 11/14/2024 5:12:20 PM    Results Labs Lab Results  Component Value Date   CHOL 146 11/08/2024   HDL 74 11/08/2024   LDLCALC 58 11/08/2024   TRIG 68 11/08/2024   CHOLHDL 2.0 11/08/2024   Lab Results   Component Value Date   NA 138 11/08/2024   K 4.4 11/08/2024   CREATININE 0.79 11/08/2024   EGFR 91 11/08/2024   GLUCOSE 91 11/08/2024   Lab Results  Component Value Date   HGBA1C 5.4 09/30/2020    Radiology Lexiscan  Myoview  Stress Test (01/13/2024): LOW RISK.,  EF 60% -> fixed basal anterior defect likely due to breast attenuation or prior infarct as there was no notable wall motion abnormality.  Diagnostic: Cardiac Cath-PCI (09/29/2020): (Anterolateral STEMI) - 100% RI (RESOLUTE ONYX 2.0X15. 2.3 mm); Dist LAD-1 lesion is 30% stenosed. Dist LAD-2 lesion is 45% stenosed. With diffuse mild-mild to moderate disease throughout the RCA and LCx   Echocardiogram ( 09/30/2020): LVEF 60 to 65%.  Apical anterior akinesis but otherwise no additional RWMA.  Normal RV size and function with normal RVP and normal RAP.  Normal aortic and mitral valves.      Risk Assessment/Calculations:            Physical Exam:   VS:  BP 136/77 (BP Location: Left Arm, Patient Position: Sitting, Cuff Size: Large)   Pulse 64   Resp 16   Ht 5' 2 (1.575 m)   Wt 184 lb 6.4 oz (83.6 kg)   SpO2 94%   BMI 33.73 kg/m    Wt Readings from Last 3 Encounters:  11/14/24 184 lb 6.4 oz (83.6 kg)  12/25/23 175 lb (79.4 kg)  12/24/23 178 lb (80.7 kg)     GEN: Mildly obese but otherwise healthy appearing.  Well-nourished well-groomed.  No acute distress. NECK: No JVD; No carotid bruits CARDIAC: Normal S1, S2; RRR, no murmurs, rubs, gallops RESPIRATORY:  Clear to auscultation without rales, wheezing or rhonchi ; nonlabored, good air movement. ABDOMEN: Soft, non-tender, non-distended EXTREMITIES:  No edema; No deformity      ASSESSMENT AND PLAN: .      History of ST elevation myocardial infarction (STEMI) of lateral wall (HCC) Anterolateral STEMI with RI occlusion treated with DES PCI.  Was relatively small caliber vessel and other vessels seem to have minimal to mild disease. No longer on Thienopyridine.   Preserved EF on echo.  Nonischemic Myoview  with most likely breast attenuation and not prior infarct.  Normal EF No further episodes.  CAD S/P PCI of 100% ost-prox RI Coronary artery disease with stable angina, post-myocardial infarction November 2021.  No current cardiac symptoms with normal Myoview  in March 2025 to evaluate atypical chest pain x 3 Blood pressure controlled with carvedilol .  Cholesterol controlled on atorvastatin . No further cardiac testing needed. - Continue aspirin  81 mg daily  Okay to hold aspirin  5 days preop for surgeries or procedures. - Continue atorvastatin  40 mg daily. - Continue carvedilol  3.125 mg twice daily. - Scheduled yearly cardiology follow-up.  Hyperlipidemia with target LDL less than 70 LDL on most recent check was 58.  Well-controlled with 40 mg atorvastatin . -She is taking one half of an 80 mg tablet,  continue current regimen.  Borderline blood pressure Not truly hypertensive.  Coronary artery disease involving native coronary artery of native heart without angina pectoris Single-vessel disease found at the time of her Lateral MI with RI occlusion treated with DES PCI.  Otherwise minimal diffuse disease. Continue aspirin  statin and low-dose beta-blocker  Orders Placed This Encounter  Procedures   EKG 12-Lead         Follow-Up: Return in about 1 year (around 11/14/2025).      Signed, Alm MICAEL Clay, MD, MS Alm Clay, M.D., M.S. Interventional Cardiologist  [facility] Pager # [phone]      "

## 2024-11-14 NOTE — Patient Instructions (Addendum)
 Medication Instructions:   No changes *If you need a refill on your cardiac medications before your next appointment, please call your pharmacy*   Lab Work: Not  needed If you have labs (blood work) drawn today and your tests are completely normal, you will receive your results only by: MyChart Message (if you have MyChart) OR A paper copy in the mail If you have any lab test that is abnormal or we need to change your treatment, we will call you to review the results.   Testing/Procedures: Not needed   Follow-Up: At [facility], you and your health needs are our priority.  As part of our continuing mission to provide you with exceptional heart care, we have created designated Provider Care Teams.  These Care Teams include your primary Cardiologist (physician) and Advanced Practice Providers (APPs -  Physician Assistants and Nurse Practitioners) who all work together to provide you with the care you need, when you need it.     Your next appointment:   12 month(s)  The format for your next appointment:   In Person  Provider:   Alm Clay, MD or Damien Braver NP

## 2024-11-16 ENCOUNTER — Other Ambulatory Visit: Payer: Self-pay | Admitting: Cardiology

## 2024-11-19 ENCOUNTER — Encounter: Payer: Self-pay | Admitting: Cardiology

## 2024-11-19 DIAGNOSIS — I2510 Atherosclerotic heart disease of native coronary artery without angina pectoris: Secondary | ICD-10-CM | POA: Insufficient documentation

## 2024-11-19 NOTE — Assessment & Plan Note (Signed)
 LDL on most recent check was 58.  Well-controlled with 40 mg atorvastatin . -She is taking one half of an 80 mg tablet, continue current regimen.

## 2024-11-19 NOTE — Assessment & Plan Note (Signed)
 Coronary artery disease with stable angina, post-myocardial infarction November 2021.  No current cardiac symptoms with normal Myoview  in March 2025 to evaluate atypical chest pain x 3 Blood pressure controlled with carvedilol .  Cholesterol controlled on atorvastatin . No further cardiac testing needed. - Continue aspirin  81 mg daily  Okay to hold aspirin  5 days preop for surgeries or procedures. - Continue atorvastatin  40 mg daily. - Continue carvedilol  3.125 mg twice daily. - Scheduled yearly cardiology follow-up.

## 2024-11-19 NOTE — Assessment & Plan Note (Signed)
 Not truly hypertensive.

## 2024-11-19 NOTE — Assessment & Plan Note (Signed)
 Anterolateral STEMI with RI occlusion treated with DES PCI.  Was relatively small caliber vessel and other vessels seem to have minimal to mild disease. No longer on Thienopyridine.  Preserved EF on echo.  Nonischemic Myoview  with most likely breast attenuation and not prior infarct.  Normal EF No further episodes.

## 2024-11-19 NOTE — Assessment & Plan Note (Signed)
 Single-vessel disease found at the time of her Lateral MI with RI occlusion treated with DES PCI.  Otherwise minimal diffuse disease. Continue aspirin  statin and low-dose beta-blocker

## 2024-12-20 ENCOUNTER — Encounter

## 2025-01-03 ENCOUNTER — Encounter: Admitting: Internal Medicine
